# Patient Record
Sex: Female | Born: 1980 | Race: White | Hispanic: No | Marital: Single | Smoking: Never smoker
Health system: Southern US, Community
[De-identification: ages and names within clinical notes are randomized; demographics above are authoritative.]

## PROBLEM LIST (undated history)

## (undated) DIAGNOSIS — J45909 Unspecified asthma, uncomplicated: Secondary | ICD-10-CM

## (undated) HISTORY — DX: Morbid (severe) obesity due to excess calories: E66.01

## (undated) HISTORY — DX: Unspecified asthma, uncomplicated: J45.909

## (undated) HISTORY — PX: CHOLECYSTECTOMY: SHX55

## (undated) HISTORY — PX: TONSILLECTOMY: SUR1361

## (undated) HISTORY — PX: LAPAROSCOPIC GASTRIC BANDING: SHX1100

## (undated) HISTORY — PX: LAPAROSCOPIC REPAIR AND REMOVAL OF GASTRIC BAND: SHX5919

---

## 2011-02-04 ENCOUNTER — Emergency Department (HOSPITAL_COMMUNITY)
Admission: EM | Admit: 2011-02-04 | Discharge: 2011-02-04 | Disposition: A | Discharge status: Home / Self Care | Payer: 59 | Attending: Emergency Medicine | Admitting: Emergency Medicine

## 2011-02-04 DIAGNOSIS — X19XXXA Contact with other heat and hot substances, initial encounter: Secondary | ICD-10-CM | POA: Insufficient documentation

## 2011-02-04 DIAGNOSIS — T23039A Burn of unspecified degree of unspecified multiple fingers (nail), not including thumb, initial encounter: Secondary | ICD-10-CM | POA: Insufficient documentation

## 2011-02-04 DIAGNOSIS — Y92009 Unspecified place in unspecified non-institutional (private) residence as the place of occurrence of the external cause: Secondary | ICD-10-CM | POA: Insufficient documentation

## 2011-04-17 ENCOUNTER — Other Ambulatory Visit (HOSPITAL_COMMUNITY)
Admission: RE | Admit: 2011-04-17 | Discharge: 2011-04-17 | Disposition: A | Discharge status: Home / Self Care | Payer: 59 | Source: Ambulatory Visit | Attending: Obstetrics and Gynecology | Admitting: Obstetrics and Gynecology

## 2011-04-17 DIAGNOSIS — Z113 Encounter for screening for infections with a predominantly sexual mode of transmission: Secondary | ICD-10-CM | POA: Insufficient documentation

## 2011-04-17 DIAGNOSIS — R87810 Cervical high risk human papillomavirus (HPV) DNA test positive: Secondary | ICD-10-CM | POA: Insufficient documentation

## 2011-04-17 DIAGNOSIS — Z01419 Encounter for gynecological examination (general) (routine) without abnormal findings: Secondary | ICD-10-CM | POA: Insufficient documentation

## 2012-04-17 ENCOUNTER — Other Ambulatory Visit (HOSPITAL_COMMUNITY): Payer: Self-pay | Admitting: Obstetrics and Gynecology

## 2012-04-17 ENCOUNTER — Other Ambulatory Visit (HOSPITAL_COMMUNITY)
Admission: RE | Admit: 2012-04-17 | Discharge: 2012-04-17 | Disposition: A | Discharge status: Home / Self Care | Payer: 59 | Source: Ambulatory Visit | Attending: Obstetrics and Gynecology | Admitting: Obstetrics and Gynecology

## 2012-04-17 DIAGNOSIS — Z01419 Encounter for gynecological examination (general) (routine) without abnormal findings: Secondary | ICD-10-CM | POA: Insufficient documentation

## 2012-04-17 DIAGNOSIS — N915 Oligomenorrhea, unspecified: Secondary | ICD-10-CM

## 2012-04-22 ENCOUNTER — Ambulatory Visit (HOSPITAL_COMMUNITY): Payer: 59

## 2012-04-25 ENCOUNTER — Ambulatory Visit (HOSPITAL_COMMUNITY)
Admission: RE | Admit: 2012-04-25 | Discharge: 2012-04-25 | Disposition: A | Discharge status: Home / Self Care | Payer: 59 | Source: Ambulatory Visit | Attending: Obstetrics and Gynecology | Admitting: Obstetrics and Gynecology

## 2012-04-25 DIAGNOSIS — N915 Oligomenorrhea, unspecified: Secondary | ICD-10-CM | POA: Insufficient documentation

## 2012-04-25 DIAGNOSIS — E282 Polycystic ovarian syndrome: Secondary | ICD-10-CM | POA: Insufficient documentation

## 2012-11-21 ENCOUNTER — Other Ambulatory Visit (INDEPENDENT_AMBULATORY_CARE_PROVIDER_SITE_OTHER): Payer: Self-pay | Admitting: General Surgery

## 2012-11-21 ENCOUNTER — Ambulatory Visit (INDEPENDENT_AMBULATORY_CARE_PROVIDER_SITE_OTHER): Payer: 59 | Admitting: General Surgery

## 2012-11-21 ENCOUNTER — Encounter (INDEPENDENT_AMBULATORY_CARE_PROVIDER_SITE_OTHER): Payer: Self-pay | Admitting: General Surgery

## 2012-11-21 DIAGNOSIS — G4733 Obstructive sleep apnea (adult) (pediatric): Secondary | ICD-10-CM

## 2012-11-21 DIAGNOSIS — Z6841 Body Mass Index (BMI) 40.0 and over, adult: Secondary | ICD-10-CM

## 2012-11-21 DIAGNOSIS — Z01818 Encounter for other preprocedural examination: Secondary | ICD-10-CM

## 2012-11-21 LAB — CBC WITH DIFFERENTIAL/PLATELET
Eosinophils Relative: 2 % (ref 0–5)
HCT: 41.9 % (ref 36.0–46.0)
Lymphocytes Relative: 23 % (ref 12–46)
Lymphs Abs: 2 10*3/uL (ref 0.7–4.0)
MCV: 83 fL (ref 78.0–100.0)
Monocytes Absolute: 0.6 10*3/uL (ref 0.1–1.0)
Platelets: 345 10*3/uL (ref 150–400)
RBC: 5.05 MIL/uL (ref 3.87–5.11)
WBC: 8.9 10*3/uL (ref 4.0–10.5)

## 2012-11-21 LAB — COMPREHENSIVE METABOLIC PANEL
ALT: 23 U/L (ref 0–35)
Albumin: 3.9 g/dL (ref 3.5–5.2)
CO2: 27 mEq/L (ref 19–32)
Calcium: 9.2 mg/dL (ref 8.4–10.5)
Chloride: 102 mEq/L (ref 96–112)
Creat: 0.61 mg/dL (ref 0.50–1.10)
Potassium: 4.4 mEq/L (ref 3.5–5.3)

## 2012-11-21 LAB — T4: T4, Total: 10.3 ug/dL (ref 5.0–12.5)

## 2012-11-21 NOTE — Progress Notes (Signed)
Patient ID: Marilyn Carter, female   DOB: October 12, 1981, 31 y.o.   MRN: [id number]  Chief Complaint  Patient presents with  . New Evaluation    new eval bariatric    HPI Marilyn Carter is a 31 y.o. female. This patient presents for her initial weight loss surgery evaluation. She has a BMI of 59 with obesity related comorbidities of polycystic ovarian syndrome and obstructive sleep apnea although she has never been formally diagnosed with this and does not use a CPAP. She has trouble with her weight for her whole life and has tried several diets including Doylene Bode, Nutrisystem, Medifast, Weight Watchers, and has been to the chin and personal trainer can always seems to regain her weight.  She did have a LAP-BAND placed in 2006 and had actually done fairly well losing about 130 pounds with this but she says that this was mainly because she essentially became bulimic. She eventually had a slipped band and she had this removed in open fashion in 2009.  She denies any reflux except for occasional reflux with eating [place] food. HPI  Past Medical History  Diagnosis Date  . Asthma     Past Surgical History  Procedure Date  . Laparoscopic gastric banding   . Laparoscopic repair and removal of gasric band   . Cholecystectomy   . Tonsillectomy     Family History  Problem Relation Age of Onset  . Cancer Mother   . COPD Mother   . Heart disease Mother     Social History History  Substance Use Topics  . Smoking status: Never Smoker   . Smokeless tobacco: Not on file  . Alcohol Use: Yes    Allergies no known allergies  No current outpatient prescriptions on file.    Review of Systems Review of Systems All other review of systems negative or noncontributory except as stated in the HPI  Blood pressure 130/82, pulse 80, temperature 97.6 F (36.4 C), resp. rate 20, height 5\' 8"  (1.727 m), weight 364 lb (165.109 kg).  Physical Exam Physical Exam Physical Exam  Nursing note and vitals  reviewed. Constitutional: She is oriented to person, place, and time. She appears well-developed and well-nourished. No distress.  HENT:  Head: Normocephalic and atraumatic.  Mouth/Throat: No oropharyngeal exudate.  Eyes: Conjunctivae and EOM are normal. Pupils are equal, round, and reactive to light. Right eye exhibits no discharge. Left eye exhibits no discharge. No scleral icterus.  Neck: Normal range of motion. Neck supple. No tracheal deviation present.  Cardiovascular: Normal rate, regular rhythm, normal heart sounds and intact distal pulses.   Pulmonary/Chest: Effort normal and breath sounds normal. No stridor. No respiratory distress. She has no wheezes.  Abdominal: Soft. Bowel sounds are normal. She exhibits no distension and no mass. There is no tenderness. There is no rebound and no guarding. whss in upper midline as well as Lap Band scars. Musculoskeletal: Normal range of motion. She exhibits no edema and no tenderness.  Neurological: She is alert and oriented to person, place, and time.  Skin: Skin is warm and dry. No rash noted. She is not diaphoretic. No erythema. No pallor.  Psychiatric: She has a normal mood and affect. Her behavior is normal. Judgment and thought content normal.    Data Reviewed   Assessment    Morbid obesity with a BMI of 59 and PCOS and possible sleep apnea A long discussion regarding the possible weight loss options including the lap band, sleeve gastrectomy, and Roux-en-Y gastric bypass. She  is obviously not interested in the lap band.  She remains interested in the sleeve gastrectomy. We discussed the procedure as well as its pros and cons and its risks.  The risks of infection, bleeding, pain, scarring, weight regain, too little or too much weight loss, vitamin deficiencies and need for lifelong vitamin supplementation, hair loss, need for protein supplementation, leaks, stricture, reflux, food intolerance, need for reoperation and conversion to roux Y  gastric bypass, need for open surgery, injury to spleen or surrounding structures, DVT's, PE, and death again discussed with the patient and the patient expressed understanding and desires to proceed with laparoscopic vertical sleeve gastrectomy, possible open, intraoperative endoscopy. We explained the increased risk for the need for open surgery given her previous open surgery as well as her prior stomach surgery. I discussed with her the possibility of staple line leaks which would be higher in her especially given this reoperative situation.  She expressed understanding of this and would like to continue his workup and evaluation for possible sleeve gastrectomy.     Plan    We will go ahead and proceed with preoperative workup including nutrition labs, nutrition consult and psychology consultation and upper GI. We will also obtain her outside records from her previous surgeries.       Lodema Pilot DAVID 11/21/2012, 10:35 AM

## 2012-11-27 ENCOUNTER — Other Ambulatory Visit (HOSPITAL_COMMUNITY): Payer: 59

## 2012-11-27 ENCOUNTER — Other Ambulatory Visit: Payer: Self-pay

## 2012-11-27 ENCOUNTER — Ambulatory Visit (HOSPITAL_COMMUNITY)
Admission: RE | Admit: 2012-11-27 | Discharge: 2012-11-27 | Disposition: A | Discharge status: Home / Self Care | Payer: 59 | Source: Ambulatory Visit | Attending: General Surgery | Admitting: General Surgery

## 2012-11-27 DIAGNOSIS — K449 Diaphragmatic hernia without obstruction or gangrene: Secondary | ICD-10-CM | POA: Insufficient documentation

## 2012-11-27 DIAGNOSIS — K219 Gastro-esophageal reflux disease without esophagitis: Secondary | ICD-10-CM | POA: Insufficient documentation

## 2012-11-27 DIAGNOSIS — Z9884 Bariatric surgery status: Secondary | ICD-10-CM | POA: Insufficient documentation

## 2012-11-27 DIAGNOSIS — Z01818 Encounter for other preprocedural examination: Secondary | ICD-10-CM

## 2012-11-27 DIAGNOSIS — E282 Polycystic ovarian syndrome: Secondary | ICD-10-CM | POA: Insufficient documentation

## 2012-11-27 DIAGNOSIS — Z6841 Body Mass Index (BMI) 40.0 and over, adult: Secondary | ICD-10-CM | POA: Insufficient documentation

## 2012-11-27 DIAGNOSIS — G4733 Obstructive sleep apnea (adult) (pediatric): Secondary | ICD-10-CM | POA: Insufficient documentation

## 2012-12-08 ENCOUNTER — Ambulatory Visit: Payer: 59 | Admitting: *Deleted

## 2012-12-20 ENCOUNTER — Encounter: Payer: Self-pay | Admitting: *Deleted

## 2012-12-20 ENCOUNTER — Encounter: Payer: 59 | Attending: General Surgery | Admitting: *Deleted

## 2012-12-20 DIAGNOSIS — Z01818 Encounter for other preprocedural examination: Secondary | ICD-10-CM | POA: Insufficient documentation

## 2012-12-20 DIAGNOSIS — Z713 Dietary counseling and surveillance: Secondary | ICD-10-CM | POA: Insufficient documentation

## 2012-12-20 NOTE — Patient Instructions (Addendum)
   Follow Pre-Op Nutrition Goals to prepare for Gastric Sleeve Surgery.   Call the [facility] at [phone] once you have been given your surgery date to enrolled in the Pre-Op Nutrition Class. You will need to attend this nutrition class 3-4 weeks prior to your surgery.  

## 2012-12-20 NOTE — Progress Notes (Signed)
  Pre-Op Assessment Visit:  Pre-Operative Gastric Sleeve Surgery  Medical Nutrition Therapy:  Appt start time: 0900   End time:  1000.  Patient was seen on 12/20/2012 for Pre-Operative Gastric Sleeve Nutrition Assessment. Assessment and letter of approval faxed to [facility] Bariatric Surgery Program coordinator on 12/20/2012.  Approval letter sent to [facility] and will be available in the chart under the media tab.  TANITA  BODY COMP RESULTS  12/20/12   BMI (kg/m^2) 55.5   Fat Mass (lbs) 205.0   Fat Free Mass (lbs) 160.0   Total Body Water (lbs) 117.0   Handouts given during visit include:  Pre-Op Goals   Bariatric Surgery Protein Shakes  Patient to call for Pre-Op and Post-Op Nutrition Education at the [facility] when surgery is scheduled.

## 2013-01-15 ENCOUNTER — Other Ambulatory Visit (INDEPENDENT_AMBULATORY_CARE_PROVIDER_SITE_OTHER): Payer: Self-pay | Admitting: General Surgery

## 2013-01-15 DIAGNOSIS — E669 Obesity, unspecified: Secondary | ICD-10-CM

## 2013-01-27 ENCOUNTER — Encounter (HOSPITAL_COMMUNITY): Payer: Self-pay | Admitting: Pharmacy Technician

## 2013-01-29 ENCOUNTER — Encounter: Payer: 59 | Attending: General Surgery | Admitting: *Deleted

## 2013-01-29 DIAGNOSIS — Z01818 Encounter for other preprocedural examination: Secondary | ICD-10-CM | POA: Insufficient documentation

## 2013-01-29 DIAGNOSIS — Z713 Dietary counseling and surveillance: Secondary | ICD-10-CM | POA: Insufficient documentation

## 2013-01-31 NOTE — Progress Notes (Signed)
Bariatric Class:  Appt start time: 0930 end time:  1030.  Pre-Operative Nutrition Class  Patient was seen on 01/29/13 for Pre-Operative Bariatric Surgery Education at [facility].   Surgery date: 02/10/13 Surgery type: Sleeve Start weight at [facility]: 365 lbs Goal weight:   Weight today: 359.8 lbs Weight change: n/a Total weight lost: n/a  TANITA BODY COMP RESULTS   12/20/12  01/29/13  BMI (kg/m^2)  55.5  54.7  Fat Mass (lbs)  205.0  --  Fat Free Mass (lbs)  160.0  --  Total Body Water (lbs)  117.0  --   Samples given per MNT protocol: Bariatric Advantage Multivitamin Lot # [id number] Exp: 06/15  Bariatric Advantage Calcium Citrate Lot # [id number] Exp: 10/15  Bariatric Advantage Sublingual B12 Lot # [id number] Exp: 10/15  Celebrate Vitamins Complete Multivitamin Lot # [id number] Exp: 11/14  Celebrate Vitamins Calcium Citrate Lot # [id number] Exp: 08/15  Corliss Marcus Protein Powder Lot # [id number] Exp: 06/15  Premier Protein Shake Lot # [id number] Exp: 12/22/13  The following the learning objective met by the patient during this course:  Identifies Pre-Op Dietary Goals and will begin 2 weeks pre-operatively  Identifies appropriate sources of fluids and proteins   States protein recommendations and appropriate sources pre and post-operatively  Identifies Post-Operative Dietary Goals and will follow for 2 weeks post-operatively  Identifies appropriate multivitamin and calcium sources  Describes the need for physical activity post-operatively and will follow MD recommendations  States when to call healthcare provider regarding medication questions or post-operative complications  Handouts given during class include:  Pre-Op Bariatric Surgery Diet Handout  Protein Shake Handout  Post-Op Bariatric Surgery Nutrition Handout  [facility] Program Information Flyer  Support Group Information Flyer  [facility] Bariatric Supplements Price  List  Follow-Up Plan: Patient will follow-up at [facility] 2 weeks post operatively for diet advancement per MD.

## 2013-02-02 ENCOUNTER — Encounter: Payer: Self-pay | Admitting: *Deleted

## 2013-02-02 NOTE — Patient Instructions (Signed)
Follow:   Pre-Op Diet per MD 2 weeks prior to surgery  Phase 2- Liquids (clear/full) 2 weeks after surgery  Vitamin/Mineral/Calcium guidelines for purchasing bariatric supplements  Exercise guidelines pre and post-op per MD  Follow-up at [facility] in 2 weeks post-op for diet advancement. Contact Marilyn Carter as needed with questions/concerns. 

## 2013-02-04 ENCOUNTER — Ambulatory Visit (INDEPENDENT_AMBULATORY_CARE_PROVIDER_SITE_OTHER): Payer: 59 | Admitting: General Surgery

## 2013-02-04 ENCOUNTER — Encounter (INDEPENDENT_AMBULATORY_CARE_PROVIDER_SITE_OTHER): Payer: Self-pay | Admitting: General Surgery

## 2013-02-04 NOTE — Progress Notes (Signed)
Patient ID: Marilyn Carter, female   DOB: 1981/07/28, 32 y.o.   MRN: [id number]  Chief Complaint  Patient presents with  . Bariatric Pre-op    HPI Marilyn Carter is a 32 y.o. female.  This patient presents for her preoperative surgery evaluation. She has a BMI of 59 with obstructive sleep apnea and polycystic ovarian syndrome.she is scheduled for vertical sleeve gastrectomy next week. She has a history of a LAP-BAND placed in 2006 and was subsequently removed in open fashion for obstruction. I have reviewed her operative notes and records as well as her upper GI and preoperative labs. She does have another elevated TSH but her T4 is normal and she says that her primary care physician and has been following this. HPI  Past Medical History  Diagnosis Date  . Asthma   . Morbid obesity     Past Surgical History  Procedure Laterality Date  . Laparoscopic gastric banding    . Laparoscopic repair and removal of gasric band    . Cholecystectomy    . Tonsillectomy      Family History  Problem Relation Age of Onset  . Cancer Mother   . COPD Mother   . Heart disease Mother     Social History History  Substance Use Topics  . Smoking status: Never Smoker   . Smokeless tobacco: Not on file  . Alcohol Use: Yes     Comment: Rare    No Known Allergies  Current Outpatient Prescriptions  Medication Sig Dispense Refill  . aspirin-acetaminophen-caffeine (EXCEDRIN MIGRAINE) 250-250-65 MG per tablet Take 1 tablet by mouth every 6 (six) hours as needed for pain.       No current facility-administered medications for this visit.    Review of Systems Review of Systems All other review of systems negative or noncontributory except as stated in the HPI  Blood pressure 142/90, pulse 80, temperature 97.1 F (36.2 C), temperature source Temporal, resp. rate 16, height 5' 7.5" (1.715 m), weight 358 lb (162.388 kg).  Physical Exam Physical Exam Physical Exam  Nursing note and vitals  reviewed. Constitutional: She is oriented to person, place, and time. She appears well-developed and well-nourished. No distress.  HENT:  Head: Normocephalic and atraumatic.  Mouth/Throat: No oropharyngeal exudate.  Eyes: Conjunctivae and EOM are normal. Pupils are equal, round, and reactive to light. Right eye exhibits no discharge. Left eye exhibits no discharge. No scleral icterus.  Neck: Normal range of motion. Neck supple. No tracheal deviation present.  Cardiovascular: Normal rate, regular rhythm, normal heart sounds and intact distal pulses.   Pulmonary/Chest: Effort normal and breath sounds normal. No stridor. No respiratory distress. She has no wheezes.  Abdominal: Soft. Bowel sounds are normal. She exhibits no distension and no mass. There is no tenderness. There is no rebound and no guarding.  Musculoskeletal: Normal range of motion. She exhibits no edema and no tenderness.  Neurological: She is alert and oriented to person, place, and time.  Skin: Skin is warm and dry. No rash noted. She is not diaphoretic. No erythema. No pallor.  Psychiatric: She has a normal mood and affect. Her behavior is normal. Judgment and thought content normal.    Data Reviewed   Assessment    Morbid obesity with a BMI of 59and polycystic ovarian syndrome and obstructive sleep apnea We again discussed with the procedure and the perioperative risks and I explained again that she is higher risk for complications given the fact that this is revisional surgery. She will  be high-risk for leaks and the risk for the need for open surgery as well. The risks of infection, bleeding, pain, scarring, weight regain, too little or too much weight loss, vitamin deficiencies and need for lifelong vitamin supplementation, hair loss, need for protein supplementation, leaks, stricture, reflux, food intolerance, need for reoperation and conversion to roux Y gastric bypass, need for open surgery, injury to spleen or  surrounding structures, DVT's, PE, and death again discussed with the patient and the patient expressed understanding and desires to proceed with laparoscopic vertical sleeve gastrectomy, possible open, intraoperative endoscopy.      Plan    We will go ahead and proceed with laparoscopic vertical sleeve gastrectomy as already scheduled        Niko Penson DAVID 02/04/2013, 4:47 PM

## 2013-02-05 ENCOUNTER — Encounter (HOSPITAL_COMMUNITY)
Admission: RE | Admit: 2013-02-05 | Discharge: 2013-02-05 | Disposition: A | Discharge status: Home / Self Care | Payer: 59 | Source: Ambulatory Visit | Attending: General Surgery | Admitting: General Surgery

## 2013-02-05 ENCOUNTER — Encounter (HOSPITAL_COMMUNITY): Payer: Self-pay

## 2013-02-05 ENCOUNTER — Ambulatory Visit (HOSPITAL_COMMUNITY)
Admission: RE | Admit: 2013-02-05 | Discharge: 2013-02-05 | Disposition: A | Discharge status: Home / Self Care | Payer: 59 | Source: Ambulatory Visit | Attending: General Surgery | Admitting: General Surgery

## 2013-02-05 VITALS — BP 130/91 | HR 67 | Temp 98.4°F | Resp 18 | Ht 67.5 in | Wt 357.2 lb

## 2013-02-05 DIAGNOSIS — Z01818 Encounter for other preprocedural examination: Secondary | ICD-10-CM | POA: Insufficient documentation

## 2013-02-05 DIAGNOSIS — E669 Obesity, unspecified: Secondary | ICD-10-CM

## 2013-02-05 DIAGNOSIS — Z01812 Encounter for preprocedural laboratory examination: Secondary | ICD-10-CM | POA: Insufficient documentation

## 2013-02-05 DIAGNOSIS — R05 Cough: Secondary | ICD-10-CM | POA: Insufficient documentation

## 2013-02-05 DIAGNOSIS — J3489 Other specified disorders of nose and nasal sinuses: Secondary | ICD-10-CM | POA: Insufficient documentation

## 2013-02-05 DIAGNOSIS — R059 Cough, unspecified: Secondary | ICD-10-CM | POA: Insufficient documentation

## 2013-02-05 LAB — CBC WITH DIFFERENTIAL/PLATELET
Basophils Absolute: 0 10*3/uL (ref 0.0–0.1)
Eosinophils Absolute: 0.2 10*3/uL (ref 0.0–0.7)
Eosinophils Relative: 2 % (ref 0–5)
MCH: 27.8 pg (ref 26.0–34.0)
MCV: 80.8 fL (ref 78.0–100.0)
Platelets: 320 10*3/uL (ref 150–400)
RDW: 13.1 % (ref 11.5–15.5)
WBC: 6.6 10*3/uL (ref 4.0–10.5)

## 2013-02-05 LAB — COMPREHENSIVE METABOLIC PANEL
ALT: 44 U/L — ABNORMAL HIGH (ref 0–35)
AST: 39 U/L — ABNORMAL HIGH (ref 0–37)
Calcium: 9.2 mg/dL (ref 8.4–10.5)
Sodium: 139 mEq/L (ref 135–145)
Total Protein: 7.7 g/dL (ref 6.0–8.3)

## 2013-02-05 LAB — SURGICAL PCR SCREEN: MRSA, PCR: POSITIVE — AB

## 2013-02-05 NOTE — Progress Notes (Signed)
02-05-13 Dr. Biagio Quint sent message to in basket to inform of pt. Positive PCR sreen- MRSA> will require "Contact Isolation". W. Kennon Portela

## 2013-02-05 NOTE — Pre-Procedure Instructions (Addendum)
02-05-13 EKG 11'13 Epic. Cxr done today. Dr. Okey Dupre given update review- will see preop day of surgery . Saunders Glance 02-05-13 1430 note to Dr. Tressia Miners -pt. Positive for MRSA PCR screen will be on Contact Isolation. W. Karena Kinker,RN Pt. Made aware-returned call back and confirmed-  by pt. To use Mupirocin as directed. W. Kennon Portela

## 2013-02-05 NOTE — Patient Instructions (Addendum)
20 Marilyn Carter  02/05/2013   Your procedure is scheduled on: 3-4  -2014  Report to [facility] at       0700 AM.  Call this number if you have problems the morning of surgery: [phone]  Or Presurgical Testing [phone]([place])   Remember: Follow any bowel prep instructions per MD office.    Do not eat food:After Midnight.   Take these medicines the morning of surgery with A SIP OF WATER: Bring ProAir inhaler   Do not wear jewelry, make-up or nail polish.  Do not wear lotions, powders, or perfumes. You may wear deodorant.  Do not shave 12 hours prior to first CHG shower(legs and under arms).(face and neck okay.)  Do not bring valuables to the hospital.  Contacts, dentures or bridgework,body piercing,  may not be worn into surgery.  Leave suitcase in the car. After surgery it may be brought to your room.  For patients admitted to the hospital, checkout time is 11:00 AM the day of discharge.   Patients discharged the day of surgery will not be allowed to drive home. Must have responsible person with you x 24 hours once discharged.  Name and phone number of your driver:Parents: [phone]) Olegario Messier Mago    Special Instructions: CHG(Chlorhedine 4%-"Hibiclens","Betasept","Aplicare") Shower Use Special Wash: see special instructions.(avoid face and genitals)   Please read over the following fact sheets that you were given: MRSA Information,  Incentive Spirometry Instruction.    Failure to follow these instructions may result in Cancellation of your surgery.   Patient signature_______________________________________________________

## 2013-02-10 ENCOUNTER — Inpatient Hospital Stay (HOSPITAL_COMMUNITY): Payer: 59 | Admitting: Anesthesiology

## 2013-02-10 ENCOUNTER — Inpatient Hospital Stay (HOSPITAL_COMMUNITY)
Admission: RE | Admit: 2013-02-10 | Discharge: 2013-02-12 | DRG: 621 | Disposition: A | Discharge status: Home / Self Care | Payer: 59 | Source: Ambulatory Visit | Attending: General Surgery | Admitting: General Surgery

## 2013-02-10 ENCOUNTER — Encounter (HOSPITAL_COMMUNITY): Payer: Self-pay | Admitting: *Deleted

## 2013-02-10 ENCOUNTER — Encounter (HOSPITAL_COMMUNITY)
Admission: RE | Disposition: A | Discharge status: Home / Self Care | Payer: Self-pay | Source: Ambulatory Visit | Attending: General Surgery

## 2013-02-10 ENCOUNTER — Encounter (HOSPITAL_COMMUNITY): Payer: Self-pay | Admitting: Anesthesiology

## 2013-02-10 DIAGNOSIS — K449 Diaphragmatic hernia without obstruction or gangrene: Secondary | ICD-10-CM | POA: Diagnosis present

## 2013-02-10 DIAGNOSIS — E669 Obesity, unspecified: Secondary | ICD-10-CM

## 2013-02-10 DIAGNOSIS — K660 Peritoneal adhesions (postprocedural) (postinfection): Secondary | ICD-10-CM | POA: Diagnosis present

## 2013-02-10 DIAGNOSIS — Z6841 Body Mass Index (BMI) 40.0 and over, adult: Secondary | ICD-10-CM

## 2013-02-10 DIAGNOSIS — R079 Chest pain, unspecified: Secondary | ICD-10-CM | POA: Diagnosis not present

## 2013-02-10 DIAGNOSIS — R110 Nausea: Secondary | ICD-10-CM | POA: Diagnosis not present

## 2013-02-10 DIAGNOSIS — G4733 Obstructive sleep apnea (adult) (pediatric): Secondary | ICD-10-CM

## 2013-02-10 HISTORY — PX: ESOPHAGOGASTRODUODENOSCOPY: SHX5428

## 2013-02-10 HISTORY — PX: LAPAROSCOPIC LYSIS OF ADHESIONS: SHX5905

## 2013-02-10 HISTORY — PX: LAPAROSCOPIC GASTRIC SLEEVE RESECTION: SHX5895

## 2013-02-10 HISTORY — PX: HIATAL HERNIA REPAIR: SHX195

## 2013-02-10 SURGERY — GASTRECTOMY, SLEEVE, LAPAROSCOPIC
Anesthesia: General | Site: Abdomen | Wound class: Clean Contaminated

## 2013-02-10 MED ORDER — PROPOFOL 10 MG/ML IV BOLUS
INTRAVENOUS | Status: DC | PRN
  Administered 2013-02-10: 50 mg via INTRAVENOUS
  Administered 2013-02-10: 250 mg via INTRAVENOUS

## 2013-02-10 MED ORDER — LIDOCAINE-EPINEPHRINE 1 %-1:100000 IJ SOLN
INTRAMUSCULAR | Status: DC | PRN
  Administered 2013-02-10: 25 mL

## 2013-02-10 MED ORDER — KETOROLAC TROMETHAMINE 30 MG/ML IJ SOLN: 15.0000 mg | Freq: Once | INTRAMUSCULAR | Status: DC | PRN

## 2013-02-10 MED ORDER — FENTANYL CITRATE 0.05 MG/ML IJ SOLN
25.0000 ug | INTRAMUSCULAR | Status: DC | PRN
  Administered 2013-02-10: 25 ug via INTRAVENOUS

## 2013-02-10 MED ORDER — LIDOCAINE HCL (CARDIAC) 20 MG/ML IV SOLN
INTRAVENOUS | Status: DC | PRN
  Administered 2013-02-10: 80 mg via INTRAVENOUS

## 2013-02-10 MED ORDER — ONDANSETRON HCL 4 MG/2ML IJ SOLN
4.0000 mg | INTRAMUSCULAR | Status: DC | PRN
  Administered 2013-02-11 (×4): 4 mg via INTRAVENOUS
  Filled 2013-02-10 (×4): qty 2

## 2013-02-10 MED ORDER — MORPHINE SULFATE 2 MG/ML IJ SOLN
2.0000 mg | INTRAMUSCULAR | Status: DC | PRN
  Administered 2013-02-10 – 2013-02-11 (×9): 4 mg via INTRAVENOUS
  Filled 2013-02-10 (×9): qty 2

## 2013-02-10 MED ORDER — FENTANYL CITRATE 0.05 MG/ML IJ SOLN
INTRAMUSCULAR | Status: DC | PRN
  Administered 2013-02-10 (×2): 100 ug via INTRAVENOUS
  Administered 2013-02-10: 50 ug via INTRAVENOUS

## 2013-02-10 MED ORDER — ONDANSETRON HCL 4 MG/2ML IJ SOLN
INTRAMUSCULAR | Status: DC | PRN
  Administered 2013-02-10: 4 mg via INTRAVENOUS

## 2013-02-10 MED ORDER — LABETALOL HCL 5 MG/ML IV SOLN
INTRAVENOUS | Status: DC | PRN
  Administered 2013-02-10: 5 mg via INTRAVENOUS

## 2013-02-10 MED ORDER — LACTATED RINGERS IV SOLN
INTRAVENOUS | Status: DC | PRN
  Administered 2013-02-10 (×2): via INTRAVENOUS

## 2013-02-10 MED ORDER — UNJURY VANILLA POWDER
2.0000 [oz_av] | Freq: Four times a day (QID) | ORAL | Status: DC
  Administered 2013-02-12: 2 [oz_av] via ORAL
  Filled 2013-02-10 (×4): qty 27

## 2013-02-10 MED ORDER — LACTATED RINGERS IR SOLN
Status: DC | PRN
  Administered 2013-02-10: 3000 mL

## 2013-02-10 MED ORDER — BUPIVACAINE HCL 0.25 % IJ SOLN
INTRAMUSCULAR | Status: DC | PRN
  Administered 2013-02-10: 25 mL

## 2013-02-10 MED ORDER — GLYCOPYRROLATE 0.2 MG/ML IJ SOLN
INTRAMUSCULAR | Status: DC | PRN
  Administered 2013-02-10: 0.3 mg via INTRAVENOUS

## 2013-02-10 MED ORDER — SUCCINYLCHOLINE CHLORIDE 20 MG/ML IJ SOLN
INTRAMUSCULAR | Status: DC | PRN
  Administered 2013-02-10: 200 mg via INTRAVENOUS

## 2013-02-10 MED ORDER — UNJURY CHOCOLATE CLASSIC POWDER
2.0000 [oz_av] | Freq: Four times a day (QID) | ORAL | Status: DC
  Administered 2013-02-12: 2 [oz_av] via ORAL
  Filled 2013-02-10 (×4): qty 27

## 2013-02-10 MED ORDER — PROMETHAZINE HCL 25 MG/ML IJ SOLN: 6.2500 mg | INTRAMUSCULAR | Status: DC | PRN

## 2013-02-10 MED ORDER — OXYCODONE-ACETAMINOPHEN 5-325 MG/5ML PO SOLN
5.0000 mL | ORAL | Status: DC | PRN
  Administered 2013-02-12: 10 mL via ORAL
  Filled 2013-02-10 (×2): qty 10

## 2013-02-10 MED ORDER — ENOXAPARIN SODIUM 40 MG/0.4ML ~~LOC~~ SOLN
40.0000 mg | Freq: Two times a day (BID) | SUBCUTANEOUS | Status: DC
  Administered 2013-02-11 – 2013-02-12 (×3): 40 mg via SUBCUTANEOUS
  Filled 2013-02-10 (×6): qty 0.4

## 2013-02-10 MED ORDER — NEOSTIGMINE METHYLSULFATE 1 MG/ML IJ SOLN
INTRAMUSCULAR | Status: DC | PRN
  Administered 2013-02-10: 2.5 mg via INTRAVENOUS

## 2013-02-10 MED ORDER — ALBUTEROL SULFATE HFA 108 (90 BASE) MCG/ACT IN AERS
2.0000 | INHALATION_SPRAY | Freq: Four times a day (QID) | RESPIRATORY_TRACT | Status: DC | PRN
  Administered 2013-02-10 (×2): 2 via RESPIRATORY_TRACT
  Filled 2013-02-10 (×2): qty 6.7

## 2013-02-10 MED ORDER — ALBUTEROL SULFATE (5 MG/ML) 0.5% IN NEBU
2.5000 mg | INHALATION_SOLUTION | Freq: Once | RESPIRATORY_TRACT | Status: AC
  Administered 2013-02-10: 2.5 mg via RESPIRATORY_TRACT

## 2013-02-10 MED ORDER — UNJURY CHICKEN SOUP POWDER
2.0000 [oz_av] | Freq: Four times a day (QID) | ORAL | Status: DC
  Filled 2013-02-10 (×4): qty 27

## 2013-02-10 MED ORDER — HYDROMORPHONE HCL PF 1 MG/ML IJ SOLN
INTRAMUSCULAR | Status: DC | PRN
  Administered 2013-02-10 (×3): 1 mg via INTRAVENOUS

## 2013-02-10 MED ORDER — ACETAMINOPHEN 160 MG/5ML PO SOLN: 650.0000 mg | ORAL | Status: DC | PRN

## 2013-02-10 MED ORDER — MIDAZOLAM HCL 5 MG/5ML IJ SOLN
INTRAMUSCULAR | Status: DC | PRN
  Administered 2013-02-10: 2 mg via INTRAVENOUS

## 2013-02-10 MED ORDER — TISSEEL VH 10 ML EX KIT
PACK | CUTANEOUS | Status: DC | PRN
  Administered 2013-02-10: 10 mL

## 2013-02-10 MED ORDER — HEPARIN SODIUM (PORCINE) 5000 UNIT/ML IJ SOLN
5000.0000 [IU] | Freq: Once | INTRAMUSCULAR | Status: AC
  Administered 2013-02-10: 5000 [IU] via SUBCUTANEOUS
  Filled 2013-02-10: qty 1

## 2013-02-10 MED ORDER — SODIUM CHLORIDE 0.9 % IV SOLN
1.0000 g | INTRAVENOUS | Status: AC
  Administered 2013-02-10: 1 g via INTRAVENOUS
  Filled 2013-02-10: qty 1

## 2013-02-10 MED ORDER — KCL IN DEXTROSE-NACL 20-5-0.45 MEQ/L-%-% IV SOLN
INTRAVENOUS | Status: DC
  Administered 2013-02-10: 23:00:00 via INTRAVENOUS
  Administered 2013-02-10: 1000 mL via INTRAVENOUS
  Administered 2013-02-11 – 2013-02-12 (×3): via INTRAVENOUS
  Filled 2013-02-10 (×7): qty 1000

## 2013-02-10 MED ORDER — ROCURONIUM BROMIDE 100 MG/10ML IV SOLN
INTRAVENOUS | Status: DC | PRN
  Administered 2013-02-10: 40 mg via INTRAVENOUS
  Administered 2013-02-10 (×4): 10 mg via INTRAVENOUS

## 2013-02-10 SURGICAL SUPPLY — 59 items
APPLICATOR COTTON TIP 6IN STRL (MISCELLANEOUS) ×6 IMPLANT
APPLIER CLIP ROT 10 11.4 M/L (STAPLE)
CABLE HIGH FREQUENCY MONO STRZ (ELECTRODE) ×3 IMPLANT
CANISTER SUCTION 2500CC (MISCELLANEOUS) ×6 IMPLANT
CHLORAPREP W/TINT 26ML (MISCELLANEOUS) ×6 IMPLANT
CLIP APPLIE ROT 10 11.4 M/L (STAPLE) IMPLANT
CLOTH BEACON ORANGE TIMEOUT ST (SAFETY) ×3 IMPLANT
DERMABOND ADVANCED (GAUZE/BANDAGES/DRESSINGS)
DERMABOND ADVANCED .7 DNX12 (GAUZE/BANDAGES/DRESSINGS) IMPLANT
DEVICE SUTURE ENDOST 10MM (ENDOMECHANICALS) ×3 IMPLANT
DEVICE TROCAR PUNCTURE CLOSURE (ENDOMECHANICALS) ×3 IMPLANT
DRAIN CHANNEL 19F RND (DRAIN) ×3 IMPLANT
DRAPE LAPAROSCOPIC ABDOMINAL (DRAPES) ×3 IMPLANT
DRAPE UTILITY 15X26 (DRAPE) ×6 IMPLANT
DUPLOJECT EASY PREP 4ML (MISCELLANEOUS) ×3 IMPLANT
ELECT REM PT RETURN 9FT ADLT (ELECTROSURGICAL) ×3
ELECTRODE REM PT RTRN 9FT ADLT (ELECTROSURGICAL) ×2 IMPLANT
EVACUATOR SILICONE 100CC (DRAIN) ×3 IMPLANT
GLOVE BIOGEL PI IND STRL 7.0 (GLOVE) ×2 IMPLANT
GLOVE BIOGEL PI INDICATOR 7.0 (GLOVE) ×1
GLOVE SURG SS PI 7.5 STRL IVOR (GLOVE) ×6 IMPLANT
GOWN STRL NON-REIN LRG LVL3 (GOWN DISPOSABLE) ×6 IMPLANT
GOWN STRL REIN XL XLG (GOWN DISPOSABLE) ×15 IMPLANT
HANDLE STAPLE EGIA 4 XL (STAPLE) IMPLANT
HOVERMATT SINGLE USE (MISCELLANEOUS) ×3 IMPLANT
KIT BASIN OR (CUSTOM PROCEDURE TRAY) ×3 IMPLANT
MARKER SKIN DUAL TIP RULER LAB (MISCELLANEOUS) ×3 IMPLANT
NEEDLE SPNL 22GX3.5 QUINCKE BK (NEEDLE) ×3 IMPLANT
NS IRRIG 1000ML POUR BTL (IV SOLUTION) ×3 IMPLANT
PENCIL BUTTON HOLSTER BLD 10FT (ELECTRODE) ×3 IMPLANT
POUCH SPECIMEN RETRIEVAL 10MM (ENDOMECHANICALS) IMPLANT
RELOAD BLACK 60MM ECHELON (STAPLE) ×6 IMPLANT
RELOAD EGIA 60 MED/THCK PURPLE (STAPLE) IMPLANT
RELOAD GREEN (STAPLE) ×9 IMPLANT
RELOAD TRI 2.0 60 XTHK VAS SUL (STAPLE) IMPLANT
SCISSORS LAP 5X35 DISP (ENDOMECHANICALS) IMPLANT
SCISSORS LAP 5X45 EPIX DISP (ENDOMECHANICALS) ×3 IMPLANT
SEALANT SURGICAL APPL DUAL CAN (MISCELLANEOUS) ×3 IMPLANT
SET IRRIG TUBING LAPAROSCOPIC (IRRIGATION / IRRIGATOR) ×3 IMPLANT
SHEARS CURVED HARMONIC AC 45CM (MISCELLANEOUS) ×3 IMPLANT
SLEEVE ENDOPATH XCEL 5M (ENDOMECHANICALS) ×9 IMPLANT
SLEEVE XCEL OPT CAN 5 100 (ENDOMECHANICALS) ×3 IMPLANT
SOLUTION ANTI FOG 6CC (MISCELLANEOUS) ×3 IMPLANT
SPONGE GAUZE 4X4 12PLY (GAUZE/BANDAGES/DRESSINGS) IMPLANT
SPONGE LAP 18X18 X RAY DECT (DISPOSABLE) ×3 IMPLANT
STAPLE ECHEON FLEX 60 POW ENDO (STAPLE) ×12 IMPLANT
STRIP PERI DRY VERITAS 60 (STAPLE) ×21 IMPLANT
SUT ETHILON 2 0 PS N (SUTURE) ×3 IMPLANT
SUT MNCRL AB 4-0 PS2 18 (SUTURE) ×6 IMPLANT
SUT SURGIDAC NAB ES-9 0 48 120 (SUTURE) ×3 IMPLANT
SUT VICRYL 0 UR6 27IN ABS (SUTURE) ×3 IMPLANT
SYR 50ML LL SCALE MARK (SYRINGE) ×3 IMPLANT
TRAY FOLEY CATH 14FRSI W/METER (CATHETERS) ×3 IMPLANT
TRAY LAP CHOLE (CUSTOM PROCEDURE TRAY) ×3 IMPLANT
TROCAR BLADELESS 15MM (ENDOMECHANICALS) ×3 IMPLANT
TROCAR BLADELESS OPT 5 100 (ENDOMECHANICALS) ×9 IMPLANT
TUBING CONNECTING 10 (TUBING) ×3 IMPLANT
TUBING ENDO SMARTCAP (MISCELLANEOUS) ×3 IMPLANT
TUBING FILTER THERMOFLATOR (ELECTROSURGICAL) ×3 IMPLANT

## 2013-02-10 NOTE — Brief Op Note (Signed)
02/10/2013  1:54 PM  PATIENT:  Marilyn Carter  32 y.o. female  PRE-OPERATIVE DIAGNOSIS:  morbid obesity  POST-OPERATIVE DIAGNOSIS:  morbid obesity  PROCEDURE:  Procedure(s) with comments: LAPAROSCOPIC GASTRIC SLEEVE RESECTION (N/A) - Laparoscopic Sleeve Gastrectomy with EGD ESOPHAGOGASTRODUODENOSCOPY (EGD) (N/A) LAPAROSCOPIC LYSIS OF ADHESIONS (N/A) LAPAROSCOPIC REPAIR OF HIATAL HERNIA (N/A)  SURGEON:  Surgeon(s) and Role:    * Lodema Pilot, DO - Primary    * Valarie Merino, MD - Assisting  PHYSICIAN ASSISTANT:   ASSISTANTS: Martin   ANESTHESIA:   general  EBL:  Total I/O In: 1000 [I.V.:1000] Out: 300 [Urine:250; Blood:50]  BLOOD ADMINISTERED:none  DRAINS: (63F blake) Jackson-Pratt drain(s) with closed bulb suction in the sleeve staple line   LOCAL MEDICATIONS USED:  MARCAINE    and LIDOCAINE   SPECIMEN:  Source of Specimen:  greater curve stomach  DISPOSITION OF SPECIMEN:  PATHOLOGY  COUNTS:  YES  TOURNIQUET:  * No tourniquets in log *  DICTATION: .Other Dictation: Dictation Number dictated  PLAN OF CARE: Admit to inpatient   PATIENT DISPOSITION:  PACU - hemodynamically stable.   Delay start of Pharmacological VTE agent (>24hrs) due to surgical blood loss or risk of bleeding: no

## 2013-02-10 NOTE — Transfer of Care (Signed)
Immediate Anesthesia Transfer of Care Note  Patient: Marilyn Carter  Procedure(s) Performed: Procedure(s) with comments: LAPAROSCOPIC GASTRIC SLEEVE RESECTION (N/A) - Laparoscopic Sleeve Gastrectomy with EGD ESOPHAGOGASTRODUODENOSCOPY (EGD) (N/A) LAPAROSCOPIC LYSIS OF ADHESIONS (N/A) LAPAROSCOPIC REPAIR OF HIATAL HERNIA (N/A)  Patient Location: PACU  Anesthesia Type:General  Level of Consciousness: sedated, patient cooperative and responds to stimulation  Airway & Oxygen Therapy: Patient Spontanous Breathing and Patient connected to face mask oxygen  Post-op Assessment: Report given to PACU RN, Post -op Vital signs reviewed and stable and Patient moving all extremities X 4  Post vital signs: Reviewed and stable  Complications: No apparent anesthesia complications

## 2013-02-10 NOTE — Progress Notes (Signed)
PACU note; nurse had disconnected pt from monitor and was ready to take pt to room when pt requested to go to different room to be near family member; calls made to floor to try to see what could be done; then pt c/0 chest tightness and requested breathing treatment; Dr. Okey Dupre, anesthes, made aware , order rec'd and treatment done

## 2013-02-10 NOTE — Anesthesia Postprocedure Evaluation (Signed)
  Anesthesia Post-op Note  Patient: Marilyn Carter  Procedure(s) Performed: Procedure(s) (LRB): LAPAROSCOPIC GASTRIC SLEEVE RESECTION (N/A) ESOPHAGOGASTRODUODENOSCOPY (EGD) (N/A) LAPAROSCOPIC LYSIS OF ADHESIONS (N/A) LAPAROSCOPIC REPAIR OF HIATAL HERNIA (N/A)  Patient Location: PACU  Anesthesia Type: General  Level of Consciousness: awake and alert   Airway and Oxygen Therapy: Patient Spontanous Breathing  Post-op Pain: mild  Post-op Assessment: Post-op Vital signs reviewed, Patient's Cardiovascular Status Stable, Respiratory Function Stable, Patent Airway and No signs of Nausea or vomiting  Last Vitals:  Filed Vitals:   02/10/13 1415  BP: 124/91  Pulse: 88  Temp:   Resp: 19    Post-op Vital Signs: stable   Complications: No apparent anesthesia complications

## 2013-02-10 NOTE — Preoperative (Signed)
Beta Blockers   Reason not to administer Beta Blockers:Not Applicable 

## 2013-02-10 NOTE — H&P (View-Only) (Signed)
Patient ID: Marilyn Carter, female   DOB: 10/22/1981, 31 y.o.   MRN: [id number]  Chief Complaint  Patient presents with  . Bariatric Pre-op    HPI Marilyn Carter is a 31 y.o. female.  This patient presents for her preoperative surgery evaluation. She has a BMI of 59 with obstructive sleep apnea and polycystic ovarian syndrome.she is scheduled for vertical sleeve gastrectomy next week. She has a history of a LAP-BAND placed in 2006 and was subsequently removed in open fashion for obstruction. I have reviewed her operative notes and records as well as her upper GI and preoperative labs. She does have another elevated TSH but her T4 is normal and she says that her primary care physician and has been following this. HPI  Past Medical History  Diagnosis Date  . Asthma   . Morbid obesity     Past Surgical History  Procedure Laterality Date  . Laparoscopic gastric banding    . Laparoscopic repair and removal of gasric band    . Cholecystectomy    . Tonsillectomy      Family History  Problem Relation Age of Onset  . Cancer Mother   . COPD Mother   . Heart disease Mother     Social History History  Substance Use Topics  . Smoking status: Never Smoker   . Smokeless tobacco: Not on file  . Alcohol Use: Yes     Comment: Rare    No Known Allergies  Current Outpatient Prescriptions  Medication Sig Dispense Refill  . aspirin-acetaminophen-caffeine (EXCEDRIN MIGRAINE) 250-250-65 MG per tablet Take 1 tablet by mouth every 6 (six) hours as needed for pain.       No current facility-administered medications for this visit.    Review of Systems Review of Systems All other review of systems negative or noncontributory except as stated in the HPI  Blood pressure 142/90, pulse 80, temperature 97.1 F (36.2 C), temperature source Temporal, resp. rate 16, height 5' 7.5" (1.715 m), weight 358 lb (162.388 kg).  Physical Exam Physical Exam Physical Exam  Nursing note and vitals  reviewed. Constitutional: She is oriented to person, place, and time. She appears well-developed and well-nourished. No distress.  HENT:  Head: Normocephalic and atraumatic.  Mouth/Throat: No oropharyngeal exudate.  Eyes: Conjunctivae and EOM are normal. Pupils are equal, round, and reactive to light. Right eye exhibits no discharge. Left eye exhibits no discharge. No scleral icterus.  Neck: Normal range of motion. Neck supple. No tracheal deviation present.  Cardiovascular: Normal rate, regular rhythm, normal heart sounds and intact distal pulses.   Pulmonary/Chest: Effort normal and breath sounds normal. No stridor. No respiratory distress. She has no wheezes.  Abdominal: Soft. Bowel sounds are normal. She exhibits no distension and no mass. There is no tenderness. There is no rebound and no guarding.  Musculoskeletal: Normal range of motion. She exhibits no edema and no tenderness.  Neurological: She is alert and oriented to person, place, and time.  Skin: Skin is warm and dry. No rash noted. She is not diaphoretic. No erythema. No pallor.  Psychiatric: She has a normal mood and affect. Her behavior is normal. Judgment and thought content normal.    Data Reviewed   Assessment    Morbid obesity with a BMI of 59and polycystic ovarian syndrome and obstructive sleep apnea We again discussed with the procedure and the perioperative risks and I explained again that she is higher risk for complications given the fact that this is revisional surgery. She will   be high-risk for leaks and the risk for the need for open surgery as well. The risks of infection, bleeding, pain, scarring, weight regain, too little or too much weight loss, vitamin deficiencies and need for lifelong vitamin supplementation, hair loss, need for protein supplementation, leaks, stricture, reflux, food intolerance, need for reoperation and conversion to roux Y gastric bypass, need for open surgery, injury to spleen or  surrounding structures, DVT's, PE, and death again discussed with the patient and the patient expressed understanding and desires to proceed with laparoscopic vertical sleeve gastrectomy, possible open, intraoperative endoscopy.      Plan    We will go ahead and proceed with laparoscopic vertical sleeve gastrectomy as already scheduled        Laron Angelini DAVID 02/04/2013, 4:47 PM    

## 2013-02-10 NOTE — Interval H&P Note (Signed)
History and Physical Interval Note:  02/10/2013 9:25 AM  Gretel Acre  has presented today for surgery, with the diagnosis of morbid obesity  The various methods of treatment have been discussed with the patient and family. After consideration of risks, benefits and other options for treatment, the patient has consented to  Procedure(s) with comments: LAPAROSCOPIC GASTRIC SLEEVE RESECTION (N/A) - Laparoscopic Sleeve Gastrectomy with EGD ESOPHAGOGASTRODUODENOSCOPY (EGD) (N/A) as a surgical intervention .  The patient's history has been reviewed, patient examined, no change in status, stable for surgery.  I have reviewed the patient's chart and labs.  Questions were answered to the patient's satisfaction.  She was seen and evaluated in the preop area.  Risks of procedure again discussed in lay terms.  The risks of infection, bleeding, pain, scarring, weight regain, too little or too much weight loss, vitamin deficiencies and need for lifelong vitamin supplementation, hair loss, need for protein supplementation, leaks, stricture, reflux, food intolerance, need for reoperation and conversion to roux Y gastric bypass, need for open surgery, injury to spleen or surrounding structures, DVT's, PE, and death again discussed with the patient and the patient expressed understanding and desires to proceed with laparoscopic vertical sleeve gastrectomy, possible open, intraoperative endoscopy.  I again expressed my concerns with the reoperative nature of the surgery and the higher risk for leaks and the need for possible open surgery. She expressed understanding and desires to proceed with lap sleeve gastrectomy.    Lodema Pilot DAVID

## 2013-02-10 NOTE — Anesthesia Preprocedure Evaluation (Addendum)
Anesthesia Evaluation  Patient identified by MRN, date of birth, ID band Patient awake    Reviewed: Allergy & Precautions, H&P , NPO status , Patient's Chart, lab work & pertinent test results  Airway Mallampati: III TM Distance: <3 FB Neck ROM: Full    Dental no notable dental hx.    Pulmonary neg pulmonary ROS,  breath sounds clear to auscultation  Pulmonary exam normal       Cardiovascular negative cardio ROS  Rhythm:Regular Rate:Normal     Neuro/Psych negative neurological ROS  negative psych ROS   GI/Hepatic negative GI ROS, Neg liver ROS,   Endo/Other  Morbid obesity  Renal/GU negative Renal ROS  negative genitourinary   Musculoskeletal negative musculoskeletal ROS (+)   Abdominal   Peds negative pediatric ROS (+)  Hematology negative hematology ROS (+)   Anesthesia Other Findings   Reproductive/Obstetrics negative OB ROS                           Anesthesia Physical Anesthesia Plan  ASA: III  Anesthesia Plan: General   Post-op Pain Management:    Induction: Intravenous  Airway Management Planned: Oral ETT  Additional Equipment:   Intra-op Plan:   Post-operative Plan: Extubation in OR  Informed Consent: I have reviewed the patients History and Physical, chart, labs and discussed the procedure including the risks, benefits and alternatives for the proposed anesthesia with the patient or authorized representative who has indicated his/her understanding and acceptance.   Dental advisory given  Plan Discussed with: CRNA and Surgeon  Anesthesia Plan Comments:         Anesthesia Quick Evaluation  

## 2013-02-11 ENCOUNTER — Encounter (HOSPITAL_COMMUNITY): Payer: Self-pay | Admitting: General Surgery

## 2013-02-11 ENCOUNTER — Inpatient Hospital Stay (HOSPITAL_COMMUNITY): Payer: 59

## 2013-02-11 LAB — COMPREHENSIVE METABOLIC PANEL
ALT: 120 U/L — ABNORMAL HIGH (ref 0–35)
AST: 114 U/L — ABNORMAL HIGH (ref 0–37)
CO2: 27 mEq/L (ref 19–32)
Chloride: 100 mEq/L (ref 96–112)
Creatinine, Ser: 0.69 mg/dL (ref 0.50–1.10)
GFR calc Af Amer: >90 mL/min (ref >90)
GFR calc non Af Amer: >90 mL/min (ref >90)
Glucose, Bld: 136 mg/dL — ABNORMAL HIGH (ref 70–99)
Sodium: 135 mEq/L (ref 135–145)
Total Bilirubin: 0.3 mg/dL (ref 0.3–1.2)

## 2013-02-11 LAB — CBC WITH DIFFERENTIAL/PLATELET
Basophils Absolute: 0 10*3/uL (ref 0.0–0.1)
Eosinophils Relative: 0 % (ref 0–5)
HCT: 37.7 % (ref 36.0–46.0)
Lymphocytes Relative: 8 % — ABNORMAL LOW (ref 12–46)
Lymphs Abs: 1 10*3/uL (ref 0.7–4.0)
MCV: 81.8 fL (ref 78.0–100.0)
Monocytes Absolute: 1 10*3/uL (ref 0.1–1.0)
Neutro Abs: 10.1 10*3/uL — ABNORMAL HIGH (ref 1.7–7.7)
RBC: 4.61 MIL/uL (ref 3.87–5.11)
RDW: 13.4 % (ref 11.5–15.5)
WBC: 12.1 10*3/uL — ABNORMAL HIGH (ref 4.0–10.5)

## 2013-02-11 MED ORDER — ALBUTEROL SULFATE (5 MG/ML) 0.5% IN NEBU
2.5000 mg | INHALATION_SOLUTION | Freq: Four times a day (QID) | RESPIRATORY_TRACT | Status: DC
  Administered 2013-02-11 – 2013-02-12 (×5): 2.5 mg via RESPIRATORY_TRACT
  Filled 2013-02-11 (×5): qty 0.5

## 2013-02-11 MED ORDER — KETOROLAC TROMETHAMINE 30 MG/ML IJ SOLN
30.0000 mg | Freq: Four times a day (QID) | INTRAMUSCULAR | Status: DC | PRN
  Administered 2013-02-11 – 2013-02-12 (×2): 30 mg via INTRAVENOUS
  Filled 2013-02-11 (×2): qty 1

## 2013-02-11 MED ORDER — IOHEXOL 300 MG/ML  SOLN: 12.5000 mL | Freq: Once | INTRAMUSCULAR | Status: AC | PRN

## 2013-02-11 MED ORDER — IOHEXOL 350 MG/ML SOLN
125.0000 mL | Freq: Once | INTRAVENOUS | Status: AC | PRN
  Administered 2013-02-11: 125 mL via INTRAVENOUS

## 2013-02-11 NOTE — Progress Notes (Signed)
Patient is alert and oriented.  VSS.  Patient with some abdominal discomfort that is relieved with prn medications.  Patient with complaints of chest pain when taking a deep breathe. Dr.Layton aware, has ordered a CT of the chest.  Patient has taken contrast, waiting to have CT done.  Patient denies burping, passing gas, or bowel movement.  Patient is ambulating in room.  Patient educated on the importance of ambulating in the hallway and continuing to use incentive spirometry.  Patient is aware of hospital support groups and BELT program.  Patient has an Aunt who had the procedure and is very supportive.  Patient has follow up appointments with [facility] and [facility].  The following discharge instructions listed below reviewed with patient and parents, patient verbalized understanding.  Marilyn Autrey, Rn  GASTRIC BYPASS / SLEEVE  Home Care Instructions  These instructions are to help you care for yourself when you go home.  Call: If you have any problems.   Call [phone] and ask for the surgeon on call   If you need immediate assistance come to the ER at [facility]. Tell the ER staff that you are a new post-op gastric bypass or gastric sleeve patient   Signs and symptoms to report:   Severe vomiting or nausea o If you cannot handle clear liquids for longer than 1 day, call your surgeon    Abdominal pain which does not get better after taking your pain medication   Fever greater than 100.4 F and chills   Heart rate over 100 beats a minute   Trouble breathing   Chest pain    Redness, swelling, drainage, or foul odor at incision (surgical) sites    If your incisions open or pull apart   Swelling or pain in calf (lower leg)   Diarrhea (Loose bowel movements that happen often), frequent watery, uncontrolled bowel movements   Constipation, (no bowel movements for 3 days) if this happens:  o Take Milk of Magnesia, 2 tablespoons by mouth, 3 times a day for 2 days if needed o Stop taking Milk of  Magnesia once you have had a bowel movement o Call your doctor if constipation continues Or o Take Miralax  (instead of Milk of Magnesia) following the label instructions o Stop taking Miralax once you have had a bowel movement o Call your doctor if constipation continues   Anything you think is "abnormal for you"   Normal side effects after surgery:   Unable to sleep at night or unable to concentrate   Irritability   Being tearful (crying) or depressed These are common complaints, possibly related to your anesthesia, stress of surgery and change in lifestyle, that usually go away a few weeks after surgery.  If these feelings continue, call your medical doctor.  Wound Care: You may have surgical glue, steri-strips, or staples over your incisions after surgery   Surgical glue:  Looks like a clear film over your incisions and will wear off a little at a time   Steri-strips : Adhesive strips of tape over your incisions. You may notice a yellowish color on the skin under the steri-strips. This is used to make the   steri-strips stick better. Do not pull the steri-strips off - let them fall off   Staples: Staples may be removed before you leave the hospital o If you go home with staples, call [facility] at for an appointment with your surgeon's nurse to have staples removed 10 days after surgery, [phone]  Showering: You may shower two (2) days after your surgery unless your surgeon tells you differently o Wash gently around incisions with warm soapy water, rinse well, and gently pat dry  o If you have a drain (tube from your incision), you may need someone to hold this while you shower  o No tub baths until staples are removed and incisions are healed     Medications:   Medications should be liquid or crushed if larger than the size of a dime   Extended release pills (medication that releases a little bit at a time through the day) should not be crushed   Depending on the  size and number of medications you take, you may need to space (take a few throughout the day)/change the time you take your medications so that you do not over-fill your pouch (smaller stomach)   Make sure you follow-up with your primary care physician to make medication changes needed during rapid weight loss and life-style changes   If you have diabetes, follow up with the doctor that orders your diabetes medication(s) within one week after surgery and check your blood sugar regularly.   Do not drive while taking narcotics (pain medications)   Do not take acetaminophen (Tylenol) and Roxicet or Lortab Elixir at the same time since these pain medications contain acetaminophen  Diet:                    First 2 Weeks  You will see the nutritionist about two (2) weeks after your surgery. The nutritionist will increase the types of foods you can eat if you are handling liquids well:   If you have severe vomiting or nausea and cannot handle clear liquids lasting longer than 1 day, call your surgeon  Protein Shake   Drink at least 2 ounces of shake 5-6 times per day   Each serving of protein shakes (usually 8 - 12 ounces) should have a minimum of:  o 15 grams of protein  o And no more than 5 grams of carbohydrate    Goal for protein each day: o Men = 80 grams per day o Women = 60 grams per day   Protein powder may be added to fluids such as non-fat milk or Lactaid milk or Soy milk (limit to 35 grams added protein powder per serving)  Hydration   Slowly increase the amount of water and other clear liquids as tolerated (See Acceptable Fluids)   Slowly increase the amount of protein shake as tolerated     Sip fluids slowly and throughout the day   May use sugar substitutes in small amounts (no more than 6 - 8 packets per day; i.e. Splenda)  Fluid Goal   The first goal is to drink at least 8 ounces of protein shake/drink per day (or as directed by the nutritionist); some examples of protein shakes  are [employer], [employer], EAS Edge HP, and Unjury. See handout from pre-op Bariatric Education Class: o Slowly increase the amount of protein shake you drink as tolerated o You may find it easier to slowly sip shakes throughout the day o It is important to get your proteins in first   Your fluid goal is to drink 64 - 100 ounces of fluid daily o It may take a few weeks to build up to this   32 oz (or more) should be clear liquids  And    32 oz (or more) should be full liquids (see  below for examples)   Liquids should not contain sugar, caffeine, or carbonation  Clear Liquids:   Water or Sugar-free flavored water (i.e. Fruit H2O, Propel)   Decaffeinated coffee or tea (sugar-free)   Crystal Lite, Wyler's Lite, Minute Maid Lite   Sugar-free Jell-O   Bouillon or broth   Sugar-free Popsicle:   *Less than 20 calories each; Limit 1 per day  Full Liquids: Protein Shakes/Drinks + 2 choices per day of other full liquids   Full liquids must be: o No More Than 12 grams of Carbs per serving  o No More Than 3 grams of Fat per serving   Strained low-fat cream soup   Non-Fat milk   Fat-free Lactaid Milk   Sugar-free yogurt (Dannon Lite & Fit, Greek yogurt)      Vitamins and Minerals   Start 1 day after surgery unless otherwise directed by your surgeon   2 Chewable Multivitamin / Multimineral Supplement with iron (i.e. Centrum for Adults)   Vitamin B-12, 350 - 500 micrograms sub-lingual (place tablet under the tongue) each day   Chewable Calcium Citrate with Vitamin D-3 (Example: 3 Chewable Calcium Plus 600 with Vitamin D-3) o Take 500 mg three (3) times a day for a total of 1500 mg each day o Do not take all 3 doses of calcium at one time as it may cause constipation, and you can only absorb 500 mg  at a time  o Do not mix multivitamins containing iron with calcium supplements; take 2 hours apart o Do not substitute Tums (calcium carbonate) for your calcium   Menstruating women  and those at risk for anemia (a blood disease that causes weakness) may need extra iron o Talk with your doctor to see if you need more iron   If you need extra iron: Total daily Iron recommendation (including Vitamins) is 50 to 100 mg Iron/day   Do not stop taking or change any vitamins or minerals until you talk to your nutritionist or surgeon   Your nutritionist and/or surgeon must approve all vitamin and mineral supplements   Activity and Exercise: It is important to continue walking at home.  Limit your physical activity as instructed by your doctor.  During this time, use these guidelines:   Do not lift anything greater than ten (10) pounds for at least two (2) weeks   Do not go back to work or drive until Designer, industrial/product says you can   You may have sex when you feel comfortable  o It is VERY important for female patients to use a reliable birth control method; fertility often increases after surgery  o Do not get pregnant for at least 18 months   Start exercising as soon as your doctor tells you that you can o Make sure your doctor approves any physical activity   Start with a simple walking program   Walk 5-15 minutes each day, 7 days per week.    Slowly increase until you are walking 30-45 minutes per day Consider joining our BELT program. [phone] or email [email or web address]   Special Instructions Things to remember:   Free counseling is available for you and your family through collaboration between [facility] and [facility]. Please call [phone] and leave a message   Use your CPAP when sleeping if this applies to you    Consider buying a medical alert bracelet that says you had lap-band surgery    You will likely have your first fill (fluid added to your band)  6 - 8 weeks after surgery   [facility] has a free Bariatric Surgery Support Group that meets monthly, the 3rd Thursday, 6 pm, [facility] Classrooms You can see classes online at  [email or web address]   It is very important to keep all follow up appointments with your surgeon, nutritionist, primary care physician, and behavioral health practitioner o After the first year, please follow up with your bariatric surgeon and nutritionist at least once a year in order to maintain best weight loss results [facility]: [phone] [facility]: [phone] Bariatric Nurse Coordinator: [phone]   Reviewed and Endorsed  by [facility] Patient Education Committee, Jan, 2014

## 2013-02-11 NOTE — Progress Notes (Signed)
1 Day Post-Op  Subjective: Had some chest pain last night.  Worse with deep breath.  Improved with albuterol.  Otherwise minimal abdominal pain  Objective: Vital signs in last 24 hours: Temp:  [98.1 F (36.7 C)-99.8 F (37.7 C)] 98.6 F (37 C) (03/05 0615) Pulse Rate:  [87-111] 100 (03/05 0615) Resp:  [16-22] 18 (03/05 0615) BP: (119-151)/(72-94) 122/81 mmHg (03/05 0615) SpO2:  [93 %-99 %] 96 % (03/05 0615) Weight:  [351 lb (159.213 kg)] 351 lb (159.213 kg) (03/04 1739) Last BM Date: 02/10/13 (PTA)  Intake/Output from previous day: 03/04 0701 - 03/05 0700 In: 3820.8 [I.V.:3820.8] Out: 2005 [Urine:1500; Drains:455; Blood:50] Intake/Output this shift:    General appearance: alert, cooperative and no distress Resp: nonlabored, no SOB, lungs clear Cardio: mild tachycardia,  HR 97-100 during my visit, regular GI: soft, appropriate upper abdominal tenderness, ND, wounds without infection, JP ss Extremities: SCD's bilat LE  Lab Results:   Recent Labs  02/11/13 0425  WBC 12.1*  HGB 12.8  HCT 37.7  PLT 280   BMET  Recent Labs  02/11/13 0425  NA 135  K 4.3  CL 100  CO2 27  GLUCOSE 136*  BUN 7  CREATININE 0.69  CALCIUM 8.6   PT/INR No results found for this basename: LABPROT, INR,  in the last 72 hours ABG No results found for this basename: PHART, PCO2, PO2, HCO3,  in the last 72 hours  Studies/Results: No results found.  Anti-infectives: Anti-infectives   Start     Dose/Rate Route Frequency Ordered Stop   02/10/13 0704  ertapenem (INVANZ) 1 g in sodium chloride 0.9 % 50 mL IVPB     1 g 100 mL/hr over 30 Minutes Intravenous On call to O.R. 02/10/13 0704 02/10/13 1005      Assessment/Plan: s/p Procedure(s) with comments: LAPAROSCOPIC GASTRIC SLEEVE RESECTION (N/A) - Laparoscopic Sleeve Gastrectomy with EGD ESOPHAGOGASTRODUODENOSCOPY (EGD) (N/A) LAPAROSCOPIC LYSIS OF ADHESIONS (N/A) LAPAROSCOPIC REPAIR OF HIATAL HERNIA (N/A) she seems to be doing okay.   Not sure cause of her chest pain. Her breathiing is nonlabored and no SOB, mild tachy but doubt PE.  Since we are planning study to evalua for leak, we will go ahead and change to CT and check CT chest as well.    LOS: 1 day    Lodema Pilot DAVID 02/11/2013

## 2013-02-11 NOTE — Op Note (Signed)
NAMEDARRIEL, SINQUEFIELD               ACCOUNT NO.:  [id number]  MEDICAL RECORD NO.:  [id number]  LOCATION:  1616                         FACILITY:  [facility]  PHYSICIAN:  Lodema Pilot, MD       DATE OF BIRTH:  July 19, 1981  DATE OF PROCEDURE:  02/10/2013 DATE OF DISCHARGE:                              OPERATIVE REPORT   PROCEDURES:  Laparoscopic lysis of adhesions about 45 minutes to 1 hour induration with laparoscopic hiatal hernia repair and laparoscopic vertical sleeve gastrectomy.  PREOPERATIVE DIAGNOSIS:  Obesity.  POSTOPERATIVE DIAGNOSIS:  Obesity.  SURGEON:  Lodema Pilot, MD  ASSISTANT:  Dr. Daphine Deutscher.  ANESTHESIA:  General endotracheal anesthesia with 50 mL of 1% lidocaine with epinephrine and 0.25% Marcaine in a 50:50 mixture.  FLUIDS:  1500 mL crystalloid.  ESTIMATED BLOOD LOSS:  50 mL.  DRAINS:  A 19-French Blake drain was placed along the 3 staple lines.  SPECIMENS:  Greater curvature of the stomach sent to Pathology for permanent section.  COMPLICATIONS:  None apparent.  FINDINGS:  Significant intra-abdominal adhesions from prior midline surgery, postoperative changes in the proximal portion of stomach consistent with prior lap band.  She had a small sliding-type hiatal hernia and sleeve gastrectomy created with 36-French bougie and 19- [place] Blake drain placed along the 3 staple lines.  Intraoperative endoscopy was negative for any leaks.  INDICATIONS FOR PROCEDURE:  Ms. Betters is a 32 year old female with a BMI of 34, who had a prior lap band placed many years ago.  She had this subsequently removed in open fashion for what she describes to be slippage and obstruction.  She has failed medical weight loss attempts and desires durable weight loss solution.  OPERATIVE DETAILS:  Ms. Koenigsberg was seen and evaluated in the preoperative area and risks and benefits of procedure were again discussed in lay terms.  Informed consent was obtained.  Again discussed with her  the increased risks of leakage and the need for possible open surgery given her large BMI and the reoperative nature of her procedure.  She expressed understanding and desired to proceed with vertical sleeve gastrectomy.  She was given prophylactic antibiotics and subcu heparin and she was taken to the operating room and placed on table in supine position.  General endotracheal anesthesia was obtained and Foley catheter was placed.  Her abdomen was prepped and draped in a standard surgical fashion and procedure time-out was performed with all operative team members to confirm proper patient and procedure.  A 5-mm Optiview trocar was used to access the abdomen and the left upper quadrant. Pneumoperitoneum was obtained.  Laparoscope was introduced and there was large amount of fatty adhesions to the abdominal wall, but there was no evidence of any bowel adhesion to the abdominal wall.  I placed another 5 mm trocar in the left lateral abdomen.  This allowed me to clear some working space in order to open up a window just under the falciform ligament so I could visualize the right side of the abdomen.  The right side of the abdomen was more clear and I placed two 5 mm trocars on that side as well and then, with these trocars, I was able  to take down the adhesions from the abdominal wall; however, it took about 45 minutes to nearly an hour to take down these adhesions in order to place my working ports.  An 11-mm left rectus port was placed under direct visualization, and a 15-mm port was placed at the base of the falciform ligament.  A Nathanson liver retractor was passed through the epigastrium through a separate stab incision to retract the left lobe of the liver.  With these ports, I was able to do the rest of dissection.  She had some adhesions along the lesser curvature to the liver, although these were easily taken down with Harmonic scalpel and this allowed [place] to retract the left lobe  of the liver more anteriorly and able to visualize the hiatus.  She had postoperative changes on her proximal stomach near the angle of His consistent with prior lap band placement, but I could see sutures placed likely at her lap band removal.  It appeared that most of her fundoplication was taken down, and so fortunately, I only needed to free up some of the adhesions from the stomach to itself in this area. She did appear to have a small hiatal hernia consistent with her upper GI and we opened up the pars flaccida and dissected it along the right crus of the diaphragm.  The esophageal ligament was divided taking care to avoid injury to the esophagus and this was divided over to the left crus as well, and I entered the hiatus.  She did have a small hiatal hernia, but the stomach was in the abdomen.  At this point, after we were confident that the fundoplication was done, I measured out 5 cm from the pylorus and started to divide the short gastric vessels along the greater curve of the stomach.  The Harmonic Scalpel was used for this division and dissection was carried up around the greater curvature up around the spleen.  It appeared that the short gastric vessels had already been divided previously, but they had essentially reformed adhesions and it appeared as though she had almost recreated short gastric vessels.  The dissection was taken all way up to the left crus and the stomach was separated from the spleen.  The left crus of the diaphragm was identified and we identified that there was no significant stomach in the hiatus posteriorly.  After we identified the left crus, this also allowed [place] to confirm that the fundoplication was taken down, and she essentially had normal-appearing anatomy at this time.  I closed the hiatus anteriorly with a single figure-of-eight suture of 0 Surgidac and this appeared to approximate the hiatus adequately.  At this point, I began create the  sleeve.  I decided to use thicker staple loads than I would normally use given the reoperative nature of this procedure.  The first firing was taken again measuring 5 cm from the pylorus using a black Echelon 60 mm staple load with Steri-Strips.  A second black staple load covered with Steri-Strips was placed in the anticipated angle of firing, and at that this point, prior to firing the stapler, a 36-French bougie was passed along the lesser curvature of the stomach into the antrum, and without reposition of the stapler, the stapler was fired.  I then transitioned to Green 60 mm staple load with Steri-Strips marching up along the bougie taking care to not tie the bougie too tightly and each placement of the stapler was confirmed anteriorly and posteriorly prior to firing the  stapler.  The green staple loads with Steri-Strips were used to completely divide the stomach along with the bougie up towards the angle of His taking care to avoid incorporating the esophagus in the stapler.  The stomach was completely transected and this was then removed through an enlarged 15-mm port site and sent to Pathology for permanent section.  The staple lines appeared adequate without any evidence of active bleeding.  There was no Christmas tree formation along the staple lines and there was no significant filing of the staple line as well.  At the end, Dr. Daphine Deutscher then performed upper endoscopy and passing a well-lubricated fiberoptic endoscope into the esophagus and to sleeve and to the pylorus.  He insufflated the air via this submerged in the water.  No evidence of any leakage.  The sleeve was very tubular without any areas of narrowing or stricture and again, there was no evidence of leakage.  The scope was removed and a Tisseel fibrin glue was placed along the sleeve staple line, and a 19-French Blake drain was placed along the staple line as well and exited through the left lower quadrant trocar site  and sutured in place with a nylon drain stitch.  The stomach extraction site was approximated with 0 Vicryl suture and Endoclose device.  The suture was secured to the abdominal wall was noted to be hemostatic.  The liver retractor was removed and the abdomen was explored for any bleeding or evidence of bowel injury and none was identified.  The final trocars were removed and the wounds were injected with 50 mL of 1% lidocaine with epinephrine and 0.25% Marcaine in a 50:50 mixture.  Skin edges were approximated with 4-0 Monocryl subcuticular suture.  Skin was washed and dried and Dermabond was applied.  All sponge, needle, and instrument counts were correct at the end of the case.  The patient tolerated the procedure well without apparent complications.          ______________________________ Lodema Pilot, MD     BL/MEDQ  D:  02/10/2013  T:  02/11/2013  Job:  [id number]

## 2013-02-11 NOTE — Progress Notes (Signed)
She is feeling better with the pain but has had some nausea.  CT negative for leak or PE.  Abdomen is appropriate.  Liquids as tolerated.

## 2013-02-12 MED ORDER — ONDANSETRON 4 MG PO TBDP: 4.0000 mg | ORAL_TABLET | Freq: Three times a day (TID) | ORAL | Status: DC | PRN

## 2013-02-12 MED ORDER — OXYCODONE-ACETAMINOPHEN 5-325 MG/5ML PO SOLN: 5.0000 mL | ORAL | Status: DC | PRN

## 2013-02-12 NOTE — Progress Notes (Signed)
2 Days Post-Op  Subjective: She is doing well.  Minimal pain, nausea seems to be resolved  Objective: Vital signs in last 24 hours: Temp:  [98.4 F (36.9 C)-99.3 F (37.4 C)] 99 F (37.2 C) (03/05 2200) Pulse Rate:  [88-98] 98 (03/06 0100) Resp:  [16] 16 (03/06 0100) BP: (144-158)/(83-100) 152/96 mmHg (03/05 2200) SpO2:  [91 %-98 %] 91 % (03/06 0139) Last BM Date: 02/11/13  Intake/Output from previous day: 03/05 0701 - 03/06 0700 In: 2982.5 [P.O.:120; I.V.:2862.5] Out: 2535 [Urine:2450; Drains:85] Intake/Output this shift: Total I/O In: 1615.8 [P.O.:120; I.V.:1495.8] Out: 660 [Urine:600; Drains:60]  General appearance: alert, cooperative and no distress Resp: clear to auscultation bilaterally Cardio: normal rate, regular GI: soft, mild incisional tenderness, ND, wounds without infection, JP ss, no peritoneal signs Extremities: SCD's bilat LE  Lab Results:   Recent Labs  02/11/13 0425  WBC 12.1*  HGB 12.8  HCT 37.7  PLT 280   BMET  Recent Labs  02/11/13 0425  NA 135  K 4.3  CL 100  CO2 27  GLUCOSE 136*  BUN 7  CREATININE 0.69  CALCIUM 8.6   PT/INR No results found for this basename: LABPROT, INR,  in the last 72 hours ABG No results found for this basename: PHART, PCO2, PO2, HCO3,  in the last 72 hours  Studies/Results: Ct Angio Chest Pe W/cm &/or Wo Cm  02/11/2013  *RADIOLOGY REPORT*  Clinical Data:  Status post sleeve gastrectomy, chest pain, evaluate for PE or leak  CT ANGIOGRAPHY CHEST CT ABDOMEN WITH CONTRAST  Technique:  Multidetector CT imaging of the chest was performed using the standard protocol during bolus administration of intravenous contrast.  Multiplanar CT image reconstructions including MIPs were obtained to evaluate the vascular anatomy. Multidetector CT imaging of the abdomen was performed using the standard protocol during bolus administration of intravenous contrast.  Contrast:  125 ml Omnipaque-300 IV  Comparison:  Chest radiographs  dated 02/05/2013  CTA CHEST  Findings:  No evidence of pulmonary embolism.  Atelectasis in the bilateral lower lobes.  No suspicious pulmonary nodules.  No pleural effusion or pneumothorax.  Visualized thyroid is grossly unremarkable.  The heart is top normal in size.  No pericardial effusion.  No suspicious mediastinal, hilar, or axillary lymphadenopathy.  Visualized osseous structures are within normal limits.  Review of the MIP images confirms the above findings.  IMPRESSION: No evidence of pulmonary embolism.  Bilateral lower lobe atelectasis.  CT ABDOMEN  Findings: Postsurgical changes related to sleeve gastrectomy. Indwelling surgical drain.  Mild stranding with a few foci of gas along the left anterior abdominal wall (for example, series 8/image 37).  No extravasation of contrast to suggest a leak.  No drainable fluid collection/abscess.  Liver, spleen, pancreas, and adrenal glands are within normal limits.  Status post cholecystectomy.  No intrahepatic or extrahepatic ductal dilatation.  Kidneys are unremarkable.  No hydronephrosis.  Visualized bowel is unremarkable.  No abdominal ascites.  Small upper abdominal lymph nodes in the porta hepatis, likely reactive.  Review of the MIP images confirms the above findings.  IMPRESSION: Postsurgical changes related to sleeve gastrectomy.  No extravasation of contrast to suggest a leak.  No drainable fluid collection/abscess.  Please note that the pelvis was not imaged.   Original Report Authenticated By: Charline Bills, M.D.    Ct Abdomen W Contrast  02/11/2013  *RADIOLOGY REPORT*  Clinical Data:  Status post sleeve gastrectomy, chest pain, evaluate for PE or leak  CT ANGIOGRAPHY CHEST CT ABDOMEN WITH  CONTRAST  Technique:  Multidetector CT imaging of the chest was performed using the standard protocol during bolus administration of intravenous contrast.  Multiplanar CT image reconstructions including MIPs were obtained to evaluate the vascular anatomy.  Multidetector CT imaging of the abdomen was performed using the standard protocol during bolus administration of intravenous contrast.  Contrast:  125 ml Omnipaque-300 IV  Comparison:  Chest radiographs dated 02/05/2013  CTA CHEST  Findings:  No evidence of pulmonary embolism.  Atelectasis in the bilateral lower lobes.  No suspicious pulmonary nodules.  No pleural effusion or pneumothorax.  Visualized thyroid is grossly unremarkable.  The heart is top normal in size.  No pericardial effusion.  No suspicious mediastinal, hilar, or axillary lymphadenopathy.  Visualized osseous structures are within normal limits.  Review of the MIP images confirms the above findings.  IMPRESSION: No evidence of pulmonary embolism.  Bilateral lower lobe atelectasis.  CT ABDOMEN  Findings: Postsurgical changes related to sleeve gastrectomy. Indwelling surgical drain.  Mild stranding with a few foci of gas along the left anterior abdominal wall (for example, series 8/image 37).  No extravasation of contrast to suggest a leak.  No drainable fluid collection/abscess.  Liver, spleen, pancreas, and adrenal glands are within normal limits.  Status post cholecystectomy.  No intrahepatic or extrahepatic ductal dilatation.  Kidneys are unremarkable.  No hydronephrosis.  Visualized bowel is unremarkable.  No abdominal ascites.  Small upper abdominal lymph nodes in the porta hepatis, likely reactive.  Review of the MIP images confirms the above findings.  IMPRESSION: Postsurgical changes related to sleeve gastrectomy.  No extravasation of contrast to suggest a leak.  No drainable fluid collection/abscess.  Please note that the pelvis was not imaged.   Original Report Authenticated By: Charline Bills, M.D.     Anti-infectives: Anti-infectives   Start     Dose/Rate Route Frequency Ordered Stop   02/10/13 0704  ertapenem (INVANZ) 1 g in sodium chloride 0.9 % 50 mL IVPB     1 g 100 mL/hr over 30 Minutes Intravenous On call to O.R. 02/10/13  0704 02/10/13 1005      Assessment/Plan: s/p Procedure(s) with comments: LAPAROSCOPIC GASTRIC SLEEVE RESECTION (N/A) - Laparoscopic Sleeve Gastrectomy with EGD ESOPHAGOGASTRODUODENOSCOPY (EGD) (N/A) LAPAROSCOPIC LYSIS OF ADHESIONS (N/A) LAPAROSCOPIC REPAIR OF HIATAL HERNIA (N/A) will try more liquids today.  If she is taking liquids okay without nausea then she should be okay for discharge later today.  will leave drain and remove in office given her revisional surgery  LOS: 2 days    Lodema Pilot DAVID 02/12/2013

## 2013-02-12 NOTE — Discharge Summary (Signed)
  Physician Discharge Summary  Patient ID: Marilyn Carter MRN: [id number] DOB/AGE: [birth year] 32 y.o.  Admit date: 02/10/2013 Discharge date: 02/12/2013  Admission Diagnoses: obesity  Discharge Diagnoses: obesity Active Problems:   * No active hospital problems. *   Discharged Condition: stable  Hospital Course: to OR 02/10/13 for revisional lap sleeve gastrectomy/EGD.  No apparent complications.  POD 1 CT was negative for leak and diet advanced.  She had some nausea initially but this resolved and was tolerating liquids.  Pain and nausea improved and she was stable for discharge to home on POD 2.  Consults: None  Significant Diagnostic Studies: radiology: CT scan: 02/11/13 negative for leak  Treatments: surgery: 02/10/13 lap sleeve gastrectomy/EGD  Disposition: 01-Home or Self Care  Discharge Orders   Future Appointments [facility] Department Dept Phone   02/24/2013 4:00 PM [facility] Post-Op Class [facility] [phone]   03/04/2013 4:15 PM Lodema Pilot, DO [facility], [place] [phone]   Future Orders Complete By Expires     Call MD for:  difficulty breathing, headache or visual disturbances  As directed     Call MD for:  persistant dizziness or light-headedness  As directed     Call MD for:  persistant nausea and vomiting  As directed     Call MD for:  redness, tenderness, or signs of infection (pain, swelling, redness, odor or green/yellow discharge around incision site)  As directed     Call MD for:  severe uncontrolled pain  As directed     Call MD for:  temperature >100.4  As directed     Discharge instructions  As directed     Comments:      Strip and record drain output every 6-8 hours and as needed. Return to the office on Tuesday at 1:30 pm to have drain removed. You will be able to shower after your drain is removed.  In the meantime, you can bathe around the drain. Full liquid diet x1 week, then pureed diet x1 week, then  soft diet x1 week, then gradually increase diet as tolerated to high protein, low fat, low carb diet. May start protein supplements and vitamins Drink plenty of fluids to stay hydrated. Crush all medications or use liquid medications for 4 weeks.    Increase activity slowly  As directed         Medication List    STOP taking these medications       aspirin-acetaminophen-caffeine 250-250-65 MG per tablet  Commonly known as:  EXCEDRIN MIGRAINE      TAKE these medications       albuterol 108 (90 BASE) MCG/ACT inhaler  Commonly known as:  PROVENTIL HFA;VENTOLIN HFA  Inhale 2 puffs into the lungs every 6 (six) hours as needed for wheezing.     ondansetron 4 MG disintegrating tablet  Commonly known as:  ZOFRAN ODT  Take 1 tablet (4 mg total) by mouth every 8 (eight) hours as needed for nausea.     oxyCODONE-acetaminophen 5-325 MG/5ML solution  Commonly known as:  ROXICET  Take 5-7 mLs by mouth every 4 (four) hours as needed.         SignedLodema Pilot DAVID 02/12/2013, 12:17 PM

## 2013-02-12 NOTE — Progress Notes (Signed)
Pt is to go home with JP drain.  Pt and family taught how to empty, measure and record JP drainage output.  No comments or concerns about JP drain, pt and family feel confident in taking care drain.  JP drain instruction and measuring sheet given to pt.

## 2013-02-17 ENCOUNTER — Encounter (INDEPENDENT_AMBULATORY_CARE_PROVIDER_SITE_OTHER): Payer: Self-pay | Admitting: General Surgery

## 2013-02-17 ENCOUNTER — Ambulatory Visit (INDEPENDENT_AMBULATORY_CARE_PROVIDER_SITE_OTHER): Payer: 59 | Admitting: General Surgery

## 2013-02-17 VITALS — BP 124/80 | HR 70 | Temp 97.3°F | Resp 18 | Ht 68.0 in | Wt 340.0 lb

## 2013-02-17 DIAGNOSIS — Z4889 Encounter for other specified surgical aftercare: Secondary | ICD-10-CM

## 2013-02-17 DIAGNOSIS — Z5189 Encounter for other specified aftercare: Secondary | ICD-10-CM

## 2013-02-17 NOTE — Progress Notes (Signed)
Subjective:     Patient ID: Marilyn Carter, female   DOB: [birth year], 32 y.o.   MRN: [id number]  HPI This patient follows up in one week status post laparoscopic vertical sleeve gastrectomy. She comes in today for drain removal. I left a drain in because of her revisional surgery and she says that she's doing well and really doesn't have much discomfort in her abdomen. She still some discomfort when she takes a deep breath but no fevers or chills or nausea or vomiting or food intolerance.  Review of Systems     Objective:   Physical Exam Her abdomen is soft and nontender exam her incisions are healing well without sign of infection her drain is serous. I removed this today and dressings applied.    Assessment:     Status post vertical sleeve gastrectomy-doing well She's doing very well from her procedure and there is no evidence of any postoperative leak. I removed her drain today in we discussed the postoperative care and diet. She seems to be doing very well.     Plan:     She will followup with me in 2 weeks.

## 2013-02-24 ENCOUNTER — Encounter: Payer: 59 | Attending: General Surgery | Admitting: *Deleted

## 2013-02-24 DIAGNOSIS — Z713 Dietary counseling and surveillance: Secondary | ICD-10-CM | POA: Insufficient documentation

## 2013-02-24 DIAGNOSIS — Z01818 Encounter for other preprocedural examination: Secondary | ICD-10-CM | POA: Insufficient documentation

## 2013-03-03 ENCOUNTER — Encounter: Payer: Self-pay | Admitting: *Deleted

## 2013-03-03 ENCOUNTER — Encounter: Payer: 59 | Admitting: *Deleted

## 2013-03-03 NOTE — Patient Instructions (Addendum)
Goals:  Follow Phase 3A: High Protein until 6-8 wks after surgery; then start Phase 3B: High Protein + Non-Starchy Vegetables  Follow up with surgeon and dietitian in [place] as soon as possible  Increase lean protein foods to meet 60-80g goal  Increase fluid intake to 64oz +  Avoid drinking 15 minutes before, during and 30 minutes after eating  Aim for >30 min of physical activity daily

## 2013-03-03 NOTE — Progress Notes (Addendum)
Bariatric Class:  Appt start time: 1600 end time:  1700.  2 Week Post-Operative Nutrition Class  Patient was seen on 02/24/13 for Post-[facility]ative [facility].   Surgery date: 02/10/13  Surgery type: Sleeve  [facility] weight at [facility]: 365 lbs (12/20/12)  Weight today: 336.0 lbs  Weight change: 23.8 lbs  Total weight lost: 29.0 lbs  TANITA BODY COMP RESULTS   12/20/12  02/24/13  BMI (kg/m^2)  55.5  51.8  Fat Mass (lbs)  205.0  187.5  Fat Free Mass (lbs)  160.0  148.5  Total Body Water (lbs)  117.0  108.5   The following the learning objectives were met by the patient during this course:  Identifies Phase 3A (Soft, High Proteins) Dietary Goals and will begin from 2 weeks post-operatively to 2 months post-operatively  Identifies appropriate sources of fluids and proteins   States protein recommendations and appropriate sources post-operatively  Identifies the need for appropriate texture modifications, mastication, and bite sizes when consuming solids  Identifies appropriate multivitamin and calcium sources post-operatively  Describes the need for physical activity post-operatively and will follow MD recommendations  States when to call healthcare provider regarding medication questions or post-operative complications  Handouts given during class include:  Phase 3A: Soft, High Protein Diet Handout  Follow-Up Plan: Patient will follow-up at [facility] in 1 weeks for 3 weeks post-op nutrition visit prior to moving to [place].

## 2013-03-03 NOTE — Patient Instructions (Addendum)
Patient to follow Phase 3A-Soft, High Protein Diet and follow-up at [facility] in 6 weeks for 2 months post-op nutrition visit for diet advancement.  *Look for surgeon and dietitian in [place] once you get there.  Good luck!! :) 

## 2013-03-03 NOTE — Progress Notes (Addendum)
  Follow-up visit:  3 Weeks Post-Operative Gastric Sleeve Surgery  Medical Nutrition Therapy:  Appt start time: 0930 end time:  1000.  Primary concerns today:  Post-operative Bariatric Surgery Nutrition Management. Marilyn Carter is here for f/u before moving to [facility] tomorrow. Discussed next phase of diet (Phase 3B), importance of supplements, exercise, and finding a Careers adviser and dietitian in [place].   Surgery date: 02/10/13 Surgery type: Sleeve Start weight at [facility]: 365 lbs (12/20/12)  Weight today: 336.5 lbs Weight change: 0.5 lbs GAIN Total weight lost: 28.5 lbs  TANITA BODY COMP RESULTS   12/20/12  02/24/13 03/03/13  BMI (kg/m^2)  55.5  51.8 51.9  Fat Mass (lbs)  205.0  187.5 187.0  Fat Free Mass (lbs)  160.0  148.5 149.5  Total Body Water (lbs)  117.0  108.5 109.5   Handouts given during visit include:  Phase 3B: High Protein + Non-Starchy Vegetables - to be started at 2 months post-op.  Samples given during visit include:   Freedavite MVI: 2 bottles (5 tabs/bottle) Lot: [id number] Exp: 03/17   Nutritional Diagnosis:  [place]-3.3 Overweight/obesity related to past poor dietary habits and physical inactivity as evidenced by patient w/ recent Gastric Sleeve surgery following dietary guidelines for continued weight loss.    Intervention:  Nutrition education.  Monitoring/Evaluation:  Follow up in with surgeon and dietitian in [place] as soon as possible.

## 2013-03-04 ENCOUNTER — Ambulatory Visit (INDEPENDENT_AMBULATORY_CARE_PROVIDER_SITE_OTHER): Payer: 59 | Admitting: General Surgery

## 2013-03-04 ENCOUNTER — Encounter (INDEPENDENT_AMBULATORY_CARE_PROVIDER_SITE_OTHER): Payer: Self-pay | Admitting: General Surgery

## 2013-03-04 VITALS — BP 114/82 | HR 94 | Temp 98.1°F | Resp 16 | Ht 67.5 in | Wt 334.6 lb

## 2013-03-04 DIAGNOSIS — Z5189 Encounter for other specified aftercare: Secondary | ICD-10-CM

## 2013-03-04 DIAGNOSIS — Z4889 Encounter for other specified surgical aftercare: Secondary | ICD-10-CM

## 2013-03-04 NOTE — Progress Notes (Signed)
Subjective:     Patient ID: Marilyn Carter, female   DOB: July 19, 1981, 32 y.o.   MRN: [id number]  HPI This patient follows up 3 weeks status post revisional sleeve gastrectomy. She's doing very well and has no complaints. She is taking about 40 g of protein per day and has not food intolerances. She is able to tolerate chicken. She is taking her vitamins and staying hydrated and she is off her pain medication. She has no complaints.  Review of Systems     Objective:   Physical Exam No distress and nontoxic-appearing Her abdomen is soft and nontender her incisions are healing well. She did have an exposed suture and removed this today.    Assessment:     Status post a vertical sleeve gastrectomy-doing well She's doing very well from her procedure and no complaints. Unfortunately, she is moving to [place] today due to the job transfer. She says that she has established care with a surgeon at the can follow her in the future. I recommended that she get some laboratory studies in another 2 months to check her nutritional values. She can go ahead and gradually increase her activity as tolerated. continue vitamin and protein supplementation    Plan:      she will followup with a surgeon in [place] and with me when she returns for visits

## 2013-04-11 IMAGING — US US PELVIS COMPLETE
1 series · 13 of 25 positions shown · non-contrast
Comparison: None.

CLINICAL DATA: Oligomenorrhea.  Morbid obesity.  Polycystic ovarian
syndrome.  LMP 12/12/2011

TRANSABDOMINAL AND TRANSVAGINAL ULTRASOUND OF PELVIS
TECHNIQUE: Both transabdominal and transvaginal ultrasound
examinations of the pelvis were performed. Transabdominal technique
was performed for global imaging of the pelvis including uterus,
ovaries, adnexal regions, and pelvic cul-de-sac.

[Series 1: us pelvis complete · 13 of 51 slices shown]
[im 1/51]
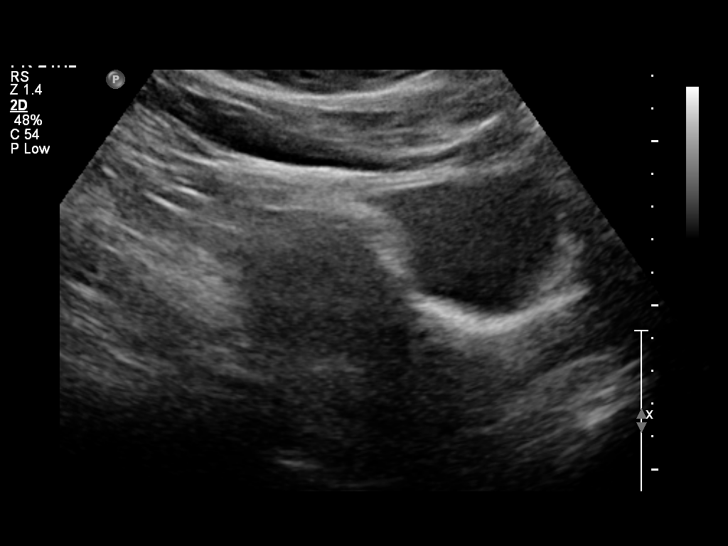
[im 5/51]
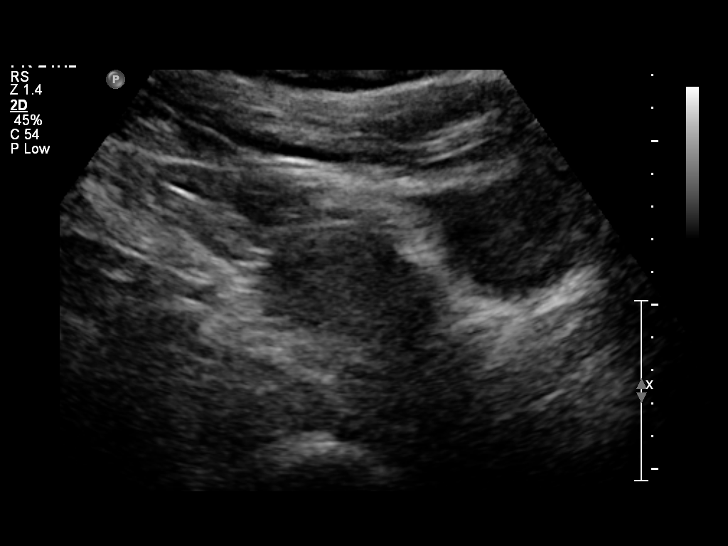
[im 9/51]
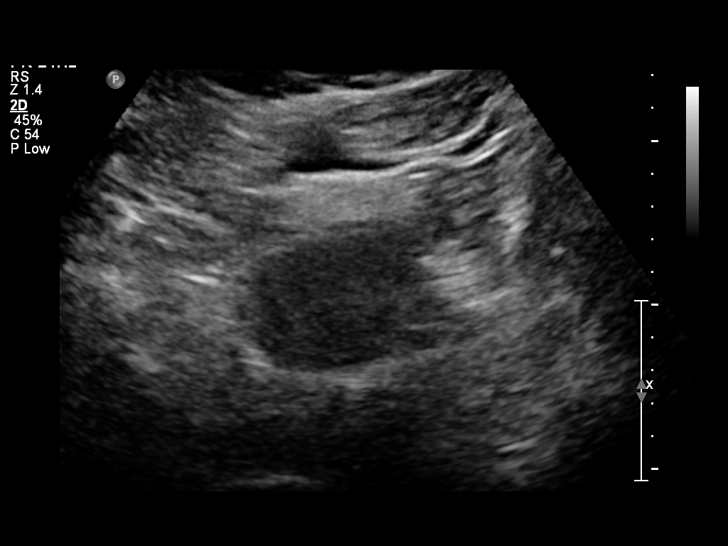
[im 13/51]
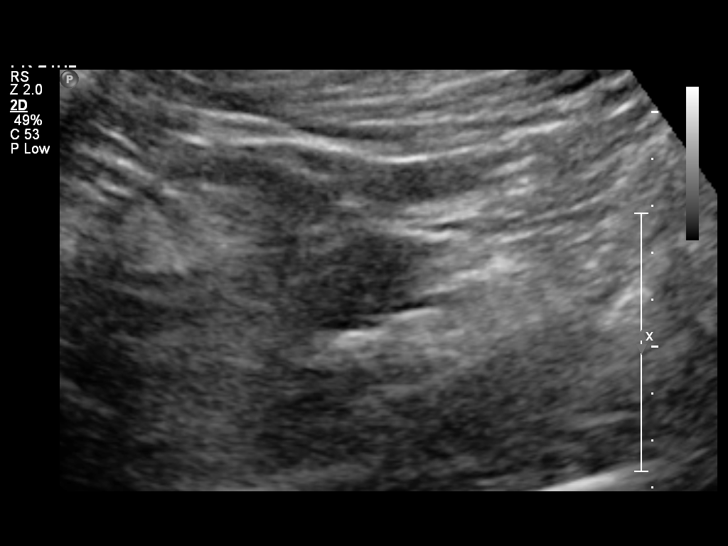
[im 17/51]
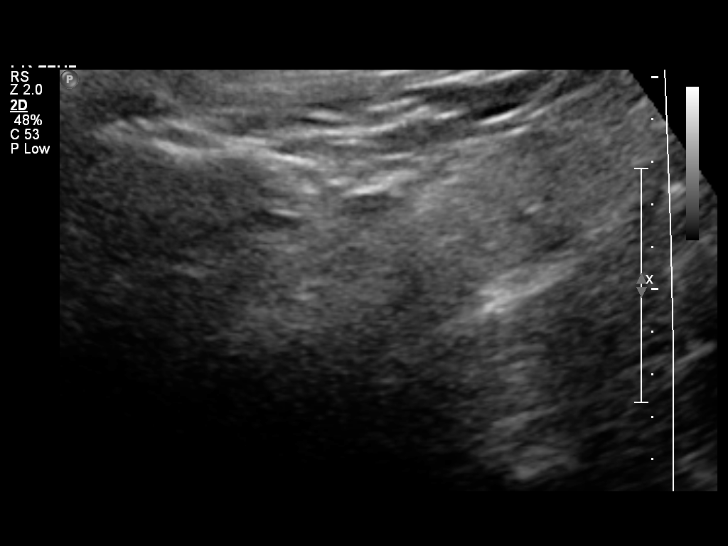
[im 21/51]
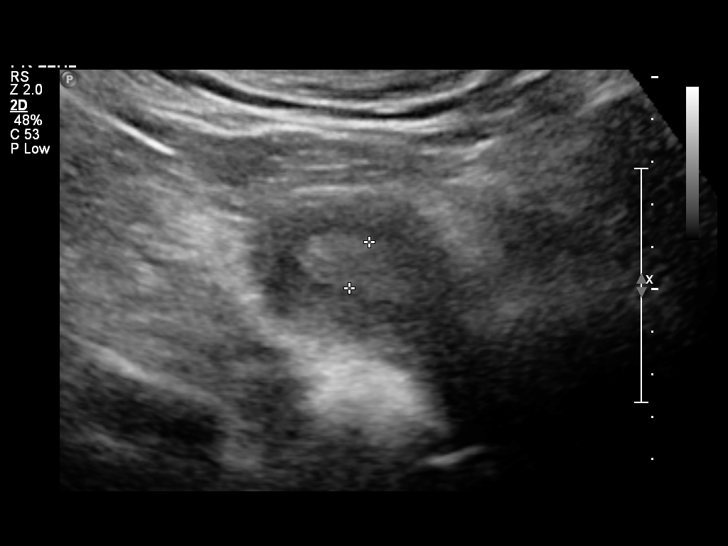
[im 26/51]
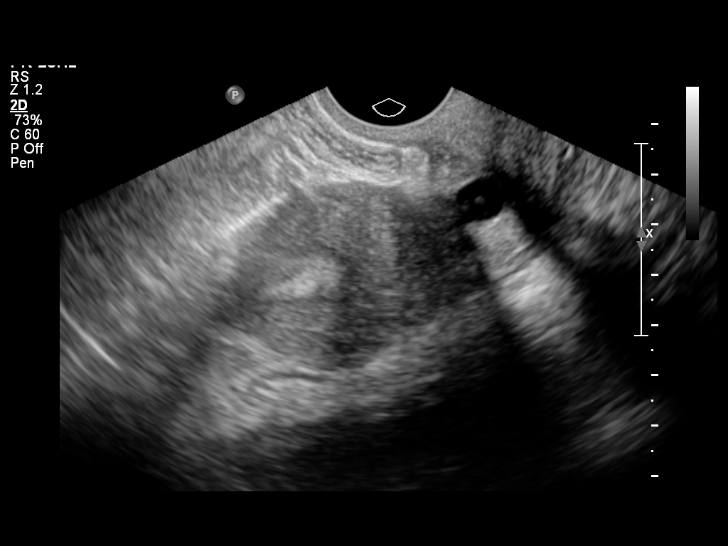
[im 30/51]
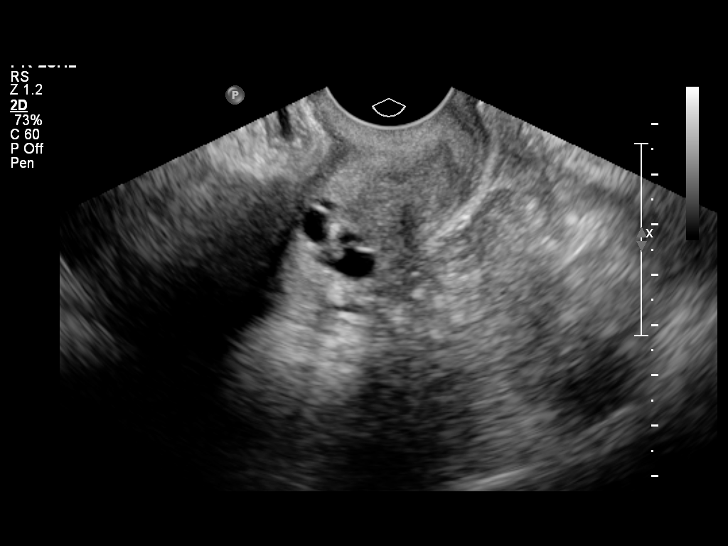
[im 34/51]
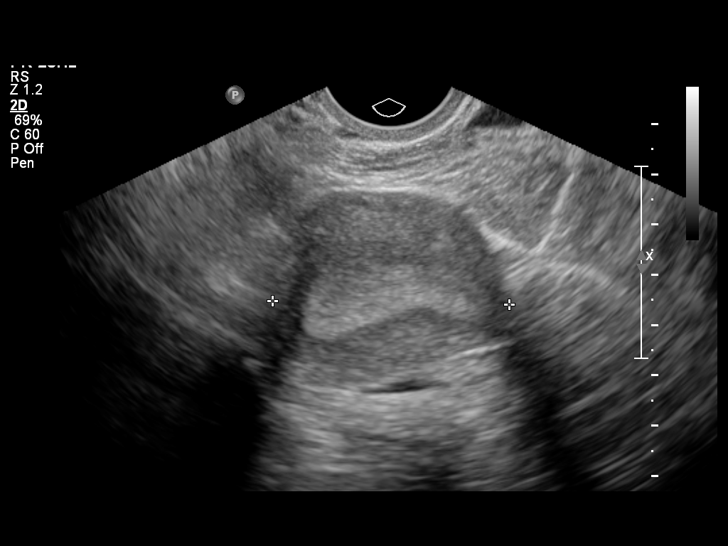
[im 38/51]
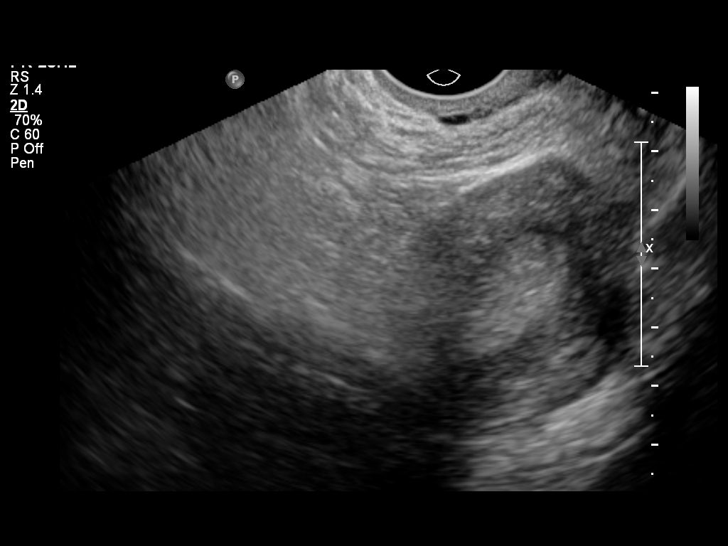
[im 42/51]
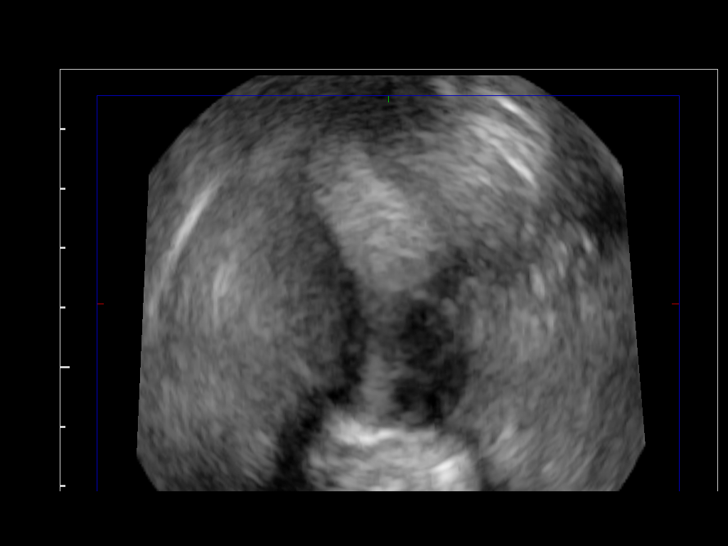
[im 46/51]
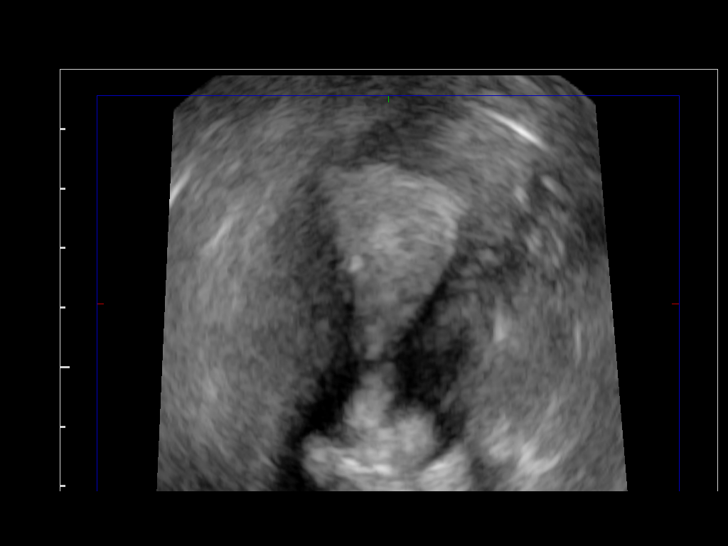
[im 51/51]
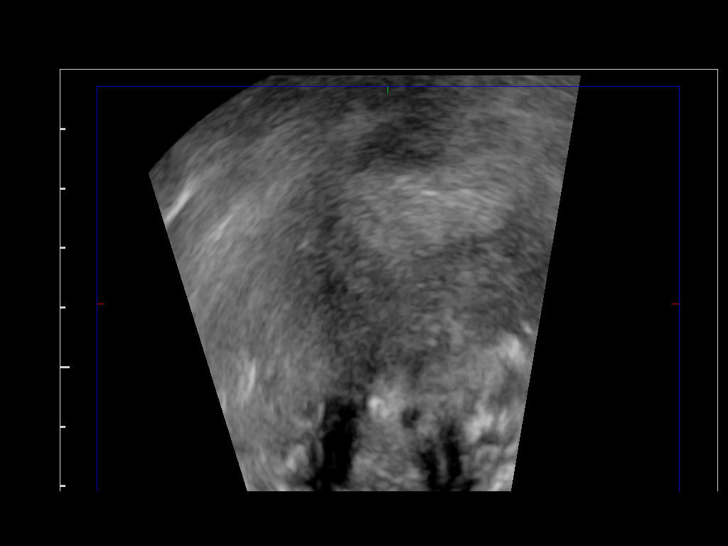

[13 of 25 positions shown; findings below may reference images not displayed]

It was necessary to proceed with endovaginal exam following the
transabdominal exam to visualize the myometrium, endometrium and
adnexa.
FINDINGS: Uterus: Demonstrates a sagittal length of 8.1 cm, depth of 3.9 cm
and width of 4.7 cm.  A homogeneous uterine myometrium is seen

Endometrium: Appears homogeneously echogenic with a width of
mm.  This could be due to a presecretory phase of cycle given the
provided LMP.  No definite focal abnormalities are identified.

Right ovary:  Is not seen either transabdominally or endovaginally

Left ovary: Measures 1.9 x 2.5 x 1.7 cm and is seen well only
transabdominally.

Other findings: No pelvic fluid or separate adnexal masses are
seen.
IMPRESSION: Homogeneously echogenic and mildly prominent endometrial lining may
be due to a presecretory phase of cycle. Hyperplasia is not
excluded and re-examination after the next onset of menses may be
useful to reassess endometrial width and examine for small focal
abnormalities.

Normal myometrium and left ovary.  No sonographic features
suggestive of a polycystic ovary are seen associated with the left
ovary..

## 2016-09-12 ENCOUNTER — Encounter (HOSPITAL_COMMUNITY): Payer: Self-pay

## 2017-03-14 ENCOUNTER — Telehealth (HOSPITAL_COMMUNITY): Payer: Self-pay

## 2017-03-14 NOTE — Telephone Encounter (Signed)
This patient is overdue for recommended follow-up with a bariatric surgeon at [facility]. A letter was mailed to the address on file from both [facility] & [facility] in attempt to reestablish post-op care. Letter has been returned to [employer] marked undeliverable, unable to forward. No additional address on file in [facility] or [employer]. No response as well from attempt at emailing. Information was shared with Dario Guardian today at [facility] so she may contact the patient via phone in attempt to get the patient scheduled for an appointment in their office.
